# Patient Record
Sex: Male | Born: 1935 | Race: White | Hispanic: No | Smoking: Former smoker
Health system: Southern US, Community
[De-identification: ages and names within clinical notes are randomized; demographics above are authoritative.]

## PROBLEM LIST (undated history)

## (undated) DIAGNOSIS — I10 Essential (primary) hypertension: Secondary | ICD-10-CM

## (undated) DIAGNOSIS — N400 Enlarged prostate without lower urinary tract symptoms: Secondary | ICD-10-CM

## (undated) DIAGNOSIS — Z95810 Presence of automatic (implantable) cardiac defibrillator: Secondary | ICD-10-CM

## (undated) DIAGNOSIS — Z950 Presence of cardiac pacemaker: Secondary | ICD-10-CM

## (undated) DIAGNOSIS — I255 Ischemic cardiomyopathy: Secondary | ICD-10-CM

## (undated) DIAGNOSIS — I495 Sick sinus syndrome: Secondary | ICD-10-CM

## (undated) DIAGNOSIS — C189 Malignant neoplasm of colon, unspecified: Secondary | ICD-10-CM

## (undated) HISTORY — DX: Benign prostatic hyperplasia without lower urinary tract symptoms: N40.0

## (undated) HISTORY — DX: Presence of cardiac pacemaker: Z95.0

## (undated) HISTORY — PX: CARDIAC DEFIBRILLATOR PLACEMENT: SHX171

## (undated) HISTORY — DX: Presence of automatic (implantable) cardiac defibrillator: Z95.810

## (undated) HISTORY — PX: CORONARY ARTERY BYPASS GRAFT: SHX141

## (undated) HISTORY — DX: Ischemic cardiomyopathy: I25.5

## (undated) HISTORY — DX: Essential (primary) hypertension: I10

## (undated) HISTORY — PX: OTHER SURGICAL HISTORY: SHX169

## (undated) HISTORY — DX: Malignant neoplasm of colon, unspecified: C18.9

## (undated) HISTORY — DX: Sick sinus syndrome: I49.5

---

## 1998-09-06 ENCOUNTER — Inpatient Hospital Stay (HOSPITAL_COMMUNITY): Admission: AD | Admit: 1998-09-06 | Discharge: 1998-09-14 | Payer: Self-pay | Admitting: Cardiology

## 1998-09-12 ENCOUNTER — Encounter: Payer: Self-pay | Admitting: Thoracic Surgery (Cardiothoracic Vascular Surgery)

## 1998-09-13 ENCOUNTER — Encounter: Payer: Self-pay | Admitting: Thoracic Surgery (Cardiothoracic Vascular Surgery)

## 2005-03-02 ENCOUNTER — Ambulatory Visit: Payer: Self-pay | Admitting: Internal Medicine

## 2005-03-11 ENCOUNTER — Ambulatory Visit: Payer: Self-pay | Admitting: Cardiology

## 2005-03-11 ENCOUNTER — Inpatient Hospital Stay (HOSPITAL_BASED_OUTPATIENT_CLINIC_OR_DEPARTMENT_OTHER): Admission: RE | Admit: 2005-03-11 | Discharge: 2005-03-11 | Payer: Self-pay | Admitting: Cardiology

## 2005-04-15 ENCOUNTER — Ambulatory Visit: Payer: Self-pay | Admitting: Internal Medicine

## 2005-04-15 ENCOUNTER — Ambulatory Visit (HOSPITAL_COMMUNITY): Admission: RE | Admit: 2005-04-15 | Discharge: 2005-04-16 | Payer: Self-pay | Admitting: Internal Medicine

## 2005-04-29 ENCOUNTER — Inpatient Hospital Stay (HOSPITAL_COMMUNITY): Admission: RE | Admit: 2005-04-29 | Discharge: 2005-05-02 | Payer: Self-pay | Admitting: Internal Medicine

## 2005-05-14 ENCOUNTER — Ambulatory Visit: Payer: Self-pay

## 2005-08-11 ENCOUNTER — Ambulatory Visit: Payer: Self-pay | Admitting: Internal Medicine

## 2005-11-03 ENCOUNTER — Ambulatory Visit: Payer: Self-pay | Admitting: Internal Medicine

## 2006-02-04 ENCOUNTER — Ambulatory Visit: Payer: Self-pay | Admitting: Internal Medicine

## 2006-05-31 ENCOUNTER — Ambulatory Visit: Payer: Self-pay | Admitting: Internal Medicine

## 2006-08-17 ENCOUNTER — Ambulatory Visit: Payer: Self-pay | Admitting: Internal Medicine

## 2006-11-16 ENCOUNTER — Ambulatory Visit: Payer: Self-pay | Admitting: Internal Medicine

## 2007-05-17 ENCOUNTER — Ambulatory Visit: Payer: Self-pay | Admitting: Internal Medicine

## 2007-07-10 IMAGING — CR DG CHEST 2V
2 series · 2 of 2 positions shown · non-contrast
Comparison: None.

CLINICAL DATA: 78-year-old with left ventricular dysfunction.  Pacemaker insertion.
 CHEST ? 2 VIEW:

[view not recorded (1 of 2)]
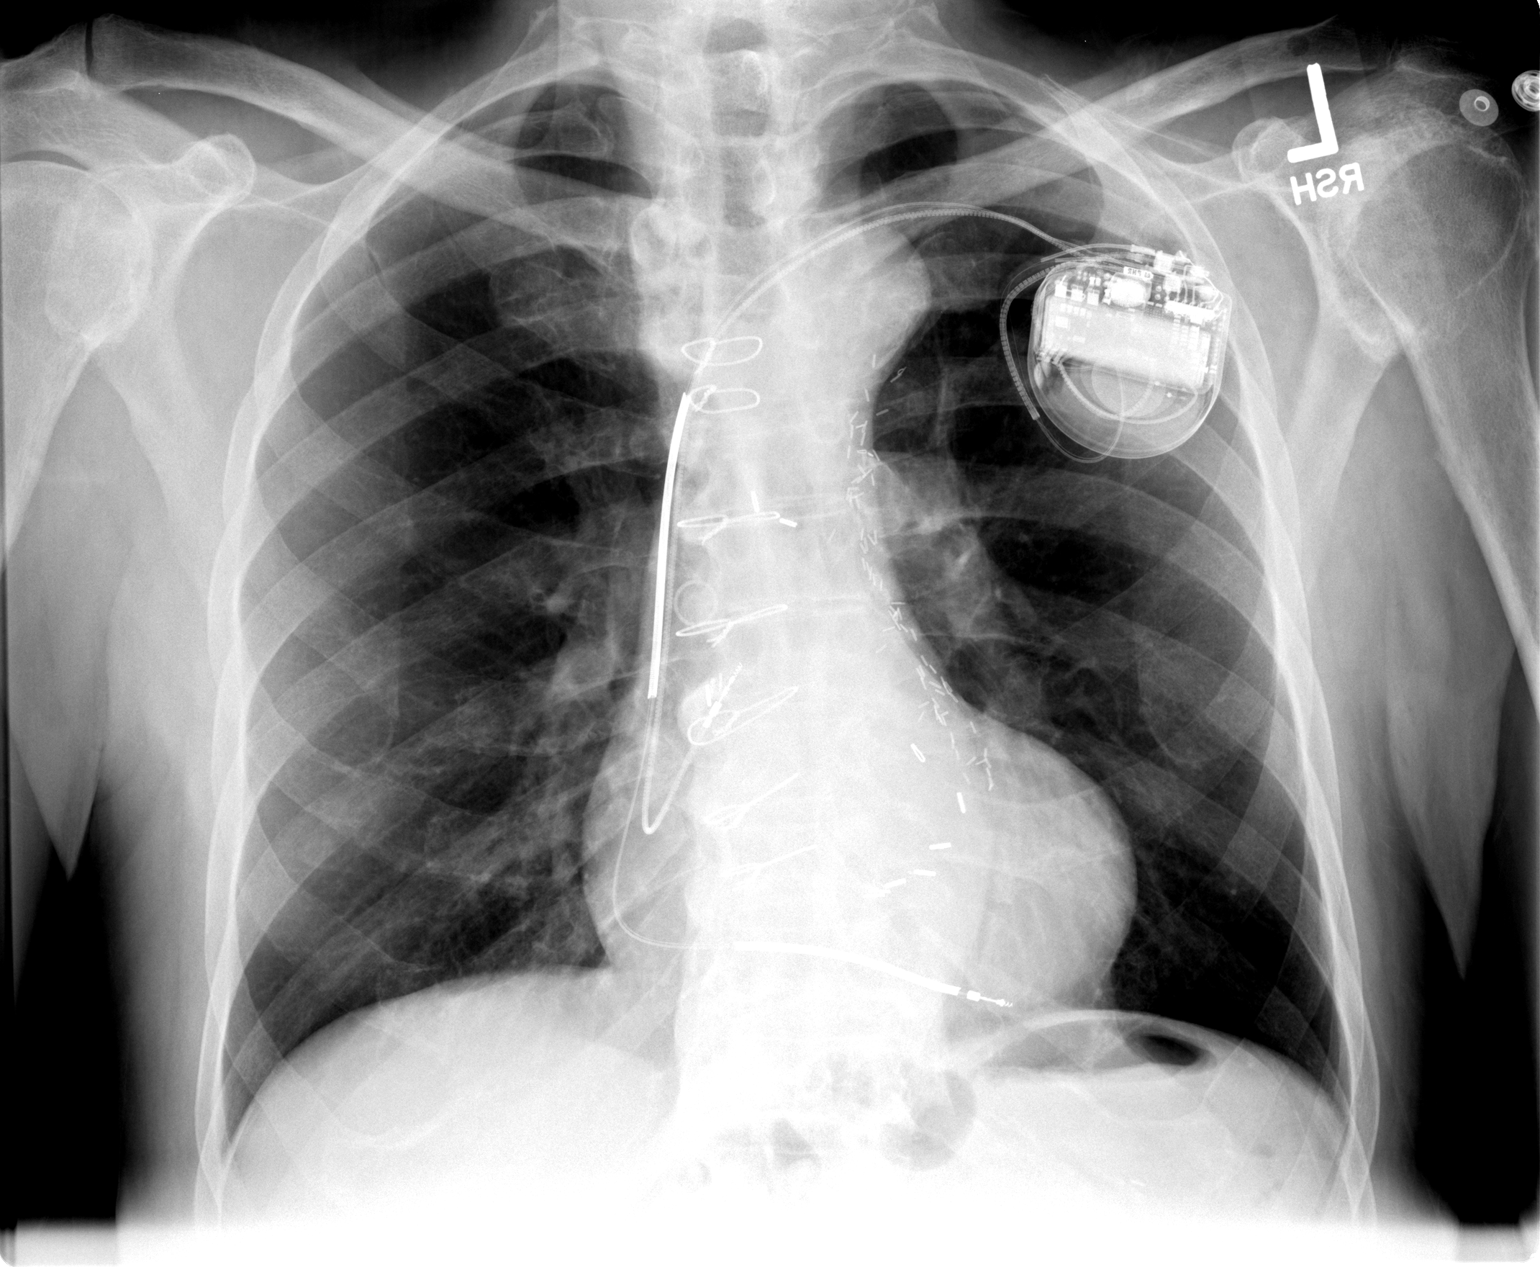

[view not recorded (2 of 2)]
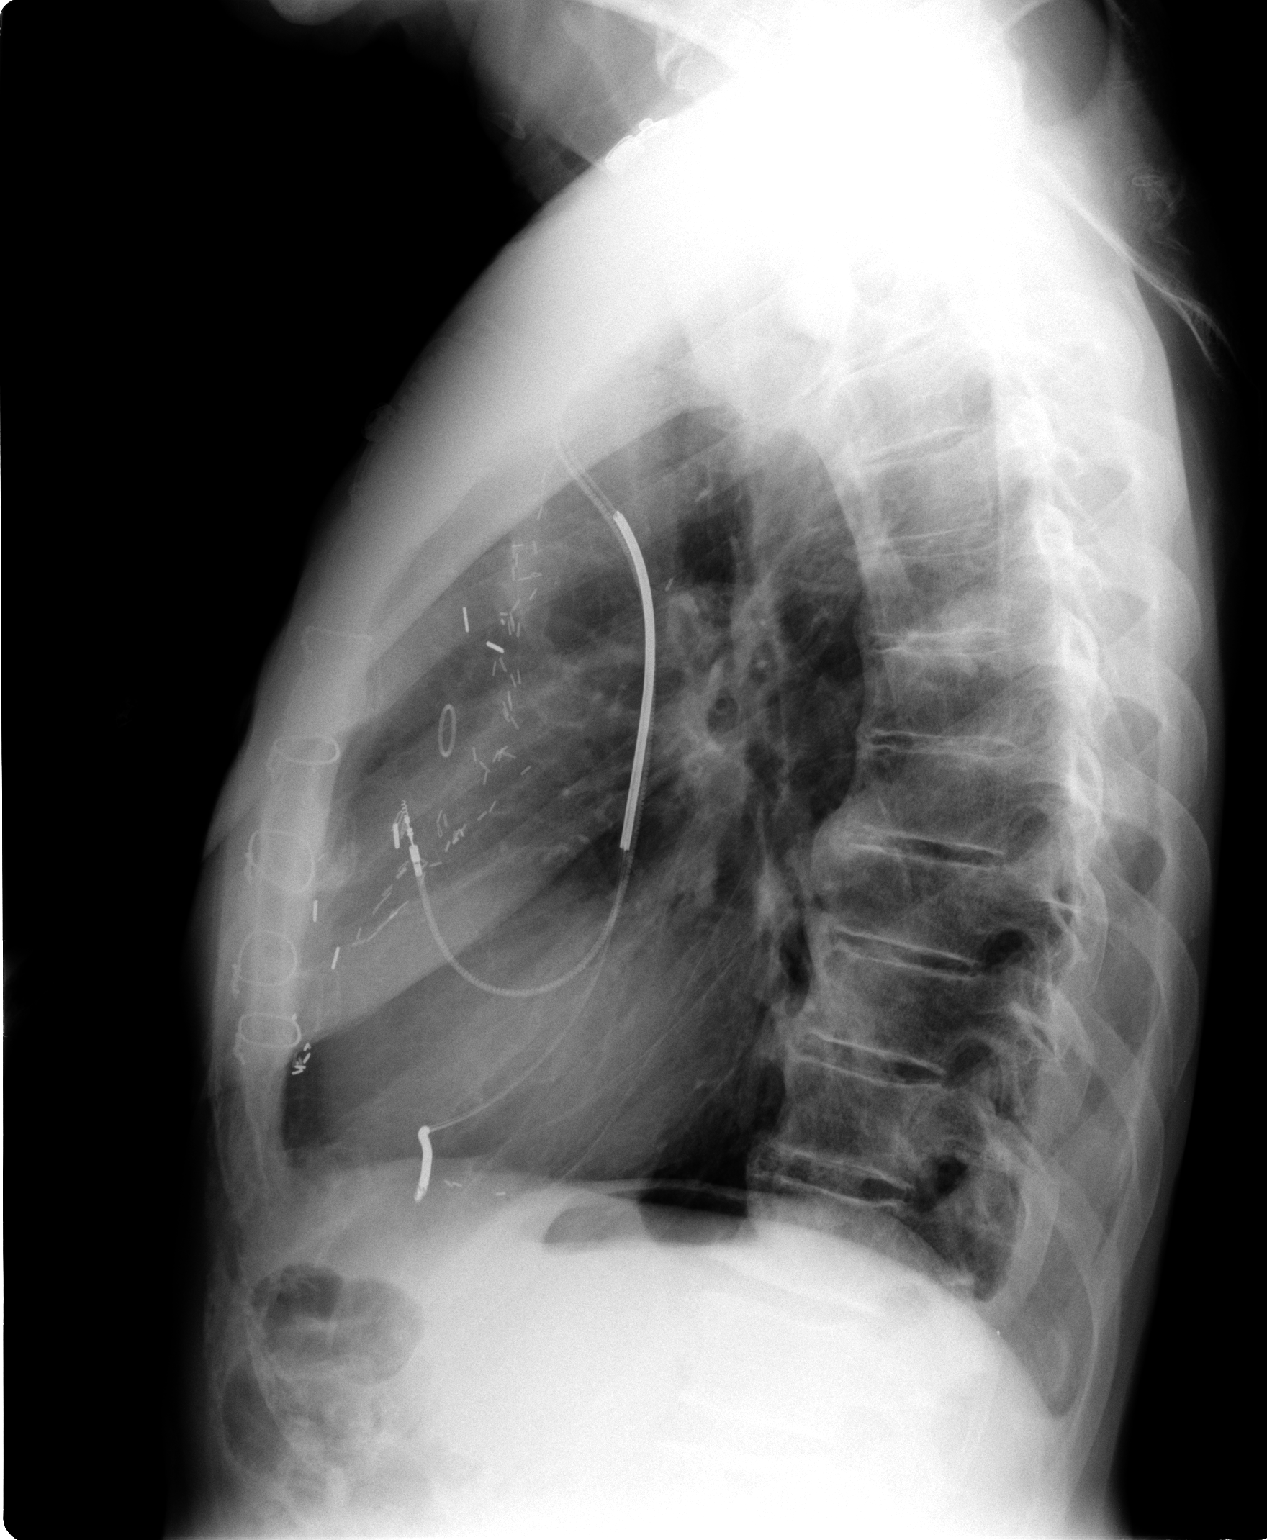

[2 of 2 positions shown; findings below may reference images not displayed]

FINDINGS: There are pacer wires and AICD in good position with one wire in the right atrium and one in the right ventricle.  No pneumothorax is seen.  Heart size is within normal limits, and lungs are clear.
IMPRESSION: 1.  Pacer wires in good position without complicating features.
 2.  No acute pulmonary findings.

## 2007-08-16 ENCOUNTER — Ambulatory Visit: Payer: Self-pay | Admitting: Internal Medicine

## 2008-02-14 ENCOUNTER — Ambulatory Visit: Payer: Self-pay | Admitting: Internal Medicine

## 2008-05-22 ENCOUNTER — Ambulatory Visit: Payer: Self-pay | Admitting: Internal Medicine

## 2008-05-23 ENCOUNTER — Ambulatory Visit: Payer: Self-pay | Admitting: Internal Medicine

## 2008-07-13 ENCOUNTER — Encounter: Payer: Self-pay | Admitting: Internal Medicine

## 2008-08-21 ENCOUNTER — Ambulatory Visit: Payer: Self-pay | Admitting: Internal Medicine

## 2008-08-30 ENCOUNTER — Encounter (INDEPENDENT_AMBULATORY_CARE_PROVIDER_SITE_OTHER): Payer: Self-pay | Admitting: *Deleted

## 2008-11-20 ENCOUNTER — Ambulatory Visit: Payer: Self-pay | Admitting: Internal Medicine

## 2008-11-20 ENCOUNTER — Encounter: Payer: Self-pay | Admitting: Internal Medicine

## 2008-11-21 ENCOUNTER — Encounter: Payer: Self-pay | Admitting: Internal Medicine

## 2009-02-19 ENCOUNTER — Ambulatory Visit: Payer: Self-pay | Admitting: Internal Medicine

## 2009-03-11 ENCOUNTER — Encounter: Payer: Self-pay | Admitting: Internal Medicine

## 2009-05-23 DIAGNOSIS — Z95810 Presence of automatic (implantable) cardiac defibrillator: Secondary | ICD-10-CM | POA: Insufficient documentation

## 2009-05-23 DIAGNOSIS — I2589 Other forms of chronic ischemic heart disease: Secondary | ICD-10-CM

## 2009-05-24 ENCOUNTER — Ambulatory Visit: Payer: Self-pay | Admitting: Internal Medicine

## 2009-08-20 ENCOUNTER — Ambulatory Visit: Payer: Self-pay | Admitting: Internal Medicine

## 2009-08-30 ENCOUNTER — Encounter: Payer: Self-pay | Admitting: Internal Medicine

## 2009-11-21 ENCOUNTER — Ambulatory Visit: Payer: Self-pay | Admitting: Internal Medicine

## 2009-12-03 ENCOUNTER — Encounter: Payer: Self-pay | Admitting: Internal Medicine

## 2010-02-20 ENCOUNTER — Ambulatory Visit: Payer: Self-pay | Admitting: Internal Medicine

## 2010-04-03 ENCOUNTER — Encounter (INDEPENDENT_AMBULATORY_CARE_PROVIDER_SITE_OTHER): Payer: Self-pay | Admitting: *Deleted

## 2010-06-03 NOTE — Cardiovascular Report (Signed)
Summary: Office Visit Remote   Office Visit Remote   Imported By: Roderic Ovens 12/06/2009 10:42:15  _____________________________________________________________________  External Attachment:    Type:   Image     Comment:   External Document

## 2010-06-03 NOTE — Cardiovascular Report (Signed)
Summary: Office Visit   Office Visit   Imported By: Roderic Ovens 05/31/2009 15:52:05  _____________________________________________________________________  External Attachment:    Type:   Image     Comment:   External Document

## 2010-06-03 NOTE — Assessment & Plan Note (Signed)
Summary: 1 YR  ICD CK/MEDTRONIC   CC:  1 year icd check.  History of Present Illness:   [name] is seen in followup for an ICD implanted in the setting of ischemic cardiomyopathy.He is a history of bypass surgery. He also has history of sinus node dysfunction  most recent catheterization we have is 2006 demonstrating ejection fraction of 35-40% as well as a patent LIMA and vein graft to the posterior descending     Current Medications (verified): 1)  Amlodipine Besylate 5 Mg Tabs (Amlodipine Besylate) .... Take As Directed 2)  Benazepril Hcl 10 Mg Tabs (Benazepril Hcl) .... Once Daily 3)  Metoprolol Tartrate 50 Mg Tabs (Metoprolol Tartrate) .... 1/2 Tablet By Mouth Twice Daily 4)  Vitamin B-12 500 Mcg Tabs (Cyanocobalamin) .... Once Daily 5)  Centrum  Tabs (Multiple Vitamins-Minerals) .... Once Daily 6)  Ecotrin 325 Mg Tbec (Aspirin) .... Once Daily  Allergies (verified): 1)  ! * Celebrex 2)  ! Carvedilol  Past History:  Past Medical History: Last updated: 05/23/2009 Sinus node dysfunction Ischemic cardiomyopathy Status post implantable cardioverter-defibrillator for the above-Medtronic EnTrust  Past Surgical History: Status post implantable cardioverter-defibrillator-Medtronic EnTrust status post CABG  Family History: Negative FH of Diabetes, Hypertension, or Coronary Artery Disease  Social History: Retired   Vital Signs:  Patient profile:   77 year old male Height:      70 inches Weight:      169 pounds BMI:     24.34 Pulse rate:   65 / minute Pulse rhythm:   regular BP sitting:   160 / 80  (right arm) Cuff size:   large  Vitals Entered By: Judithe Modest CMA (May 24, 2009 10:19 AM)  Physical Exam  General:  The patient was alert and oriented in no acute distress. HEENT Normal.  Neck veins were flat, carotids were brisk.  Lungs were clear.  Heart sounds were regular without murmurs or gallops.  Abdomen was soft with active bowel sounds. There is  no clubbing cyanosis or edema. Skin Warm and dry     ICD Specifications Following MD:  Sherryl Manges, MD     ICD Vendor:  Medtronic     ICD Model Number:  D154ATG     ICD Serial Number:  [id number] ICD DOI:  04/29/2005     ICD Implanting MD:  Sherryl Manges, MD  Lead 1:    Location: RA     DOI: 04/29/2005     Model #: 4098     Serial #: [id number]     Status: active Lead 2:    Location: RV     DOI: 04/29/2005     Model #: 6213     Serial #: [id number]     Status: active  Indications::  ICM   ICD Follow Up Remote Check?  No Battery Voltage:  2.96 V     Charge Time:  9.6 seconds     Underlying rhythm:  SR ICD Dependent:  No       ICD Device Measurements Atrium:  Amplitude: 3.7 mV, Impedance: 472 ohms, Threshold: 0.5 V at 0.4 msec Right Ventricle:  Amplitude: 6.8 mV, Impedance: 464 ohms, Threshold: 0.5 V at 0.4 msec Shock Impedance: 49/60 ohms   Episodes MS Episodes:  2     Percent Mode Switch:  <0.1%     Coumadin:  No Shock:  0     ATP:  0     Nonsustained:  1      Brady Parameters  Mode DDDR+     Lower Rate Limit:  60     Upper Rate Limit 130 PAV 180     Sensed AV Delay:  150  Tachy Zones VF:  200     VT:  250     VT1:  162     Next Remote Date:  08/20/2009     Next Cardiology Appt Due:  05/04/2010 Tech Comments:   FR sensing PVC's A-sensitivity reprogrammed  0.44mV.  6949 lead stable, SIC  1.  Updated letter and magnet given to patient.  Carelink transmissions every 3 months. ROV 1 year Dr. Graciela Husbands. Altha Harm, LPN  May 24, 2009 10:59 AM   Impression & Recommendations:  Problem # 1:  CARDIOMYOPATHY, ISCHEMIC (ICD-414.8) the patient is stable post CABG. He is able to do his exercise bike 30 minutes a day. We will continue him on his current medications. It might be reasonable for DrRevenkar to consider the outpatient prolactinoma His updated medication list for this problem includes:    Amlodipine Besylate 5 Mg Tabs (Amlodipine besylate) .Marland Kitchen... Take as directed    Benazepril  Hcl 10 Mg Tabs (Benazepril hcl) ..... Once daily    Metoprolol Tartrate 50 Mg Tabs (Metoprolol tartrate) .Marland Kitchen... 1/2 tablet by mouth twice daily    Ecotrin 325 Mg Tbec (Aspirin) ..... Once daily  Orders: EKG w/ Interpretation (93000)  Problem # 2:  IMPLANTATION OF DEFIBRILLATOR, MEDTRONIC ENTRUST (ICD-V45.02) Device parameters and data were reviewed and no changes were made   Orders: EKG w/ Interpretation (93000)  Problem # 3:  SINUS NODE DYSFUNCTION (ICD-427.81) reasonable heart rate excursion with programming His updated medication list for this problem includes:    Amlodipine Besylate 5 Mg Tabs (Amlodipine besylate) .Marland Kitchen... Take as directed    Benazepril Hcl 10 Mg Tabs (Benazepril hcl) ..... Once daily    Metoprolol Tartrate 50 Mg Tabs (Metoprolol tartrate) .Marland Kitchen... 1/2 tablet by mouth twice daily    Ecotrin 325 Mg Tbec (Aspirin) ..... Once daily  Problem # 4:  FIDELIS [id number] (ICD-996.04) Concerns were reviewed  and letter Simona Huh given   Patient Instructions: 1)  Your physician recommends that you schedule a follow-up appointment in: 12 months in [place]

## 2010-06-03 NOTE — Letter (Signed)
Summary: Remote Device Check  [facility], Main Office  [address]   [place], [place] [address]   Phone: [phone]  Fax: [phone]     April 03, 2010 MRN: [id number]   [name] [address] [place], [place]  [address]   Dear [name],   Your remote transmission was recieved and reviewed by your physician.  All diagnostics were within normal limits for you.  _____Your next transmission is scheduled for:                       .  Please transmit at any time this day.  If you have a wireless device your transmission will be sent automatically.  __X____Your next office visit is scheduled for: January 2012 with Dr. Graciela Husbands in [place].   Please call our office to schedule an appointment.    Sincerely,  Altha Harm, LPN

## 2010-06-03 NOTE — Cardiovascular Report (Signed)
Summary: Office Visit Remote   Office Visit Remote   Imported By: Roderic Ovens 09/04/2009 10:10:40  _____________________________________________________________________  External Attachment:    Type:   Image     Comment:   External Document

## 2010-06-03 NOTE — Letter (Signed)
Summary: Remote Device Check  [facility], Main Office  [address]   [place], [place] [address]   Phone: [phone]  Fax: [phone]     December 03, 2009 MRN: [id number]   [name] [address] [place], [place]  [address]   Dear [name],   Your remote transmission was recieved and reviewed by your physician.  All diagnostics were within normal limits for you.  __X___Your next transmission is scheduled for:  02-20-2010.  Please transmit at any time this day.  If you have a wireless device your transmission will be sent automatically.    Sincerely,  Vella Kohler

## 2010-06-03 NOTE — Letter (Signed)
Summary: Remote Device Check  [facility], Main Office  [address]   [place], [place] [address]   Phone: [phone]  Fax: [phone]     August 30, 2009 MRN: [id number]   [facility] [address] [place], [place]  [address]   Dear Mr. Funderburg,   Your remote transmission was recieved and reviewed by your physician.  All diagnostics were within normal limits for you.  __X___Your next transmission is scheduled for:   November 21, 2009.  Please transmit at any time this day.  If you have a wireless device your transmission will be sent automatically.     Sincerely,  Proofreader

## 2010-06-23 ENCOUNTER — Ambulatory Visit: Payer: Self-pay | Admitting: Internal Medicine

## 2010-06-23 ENCOUNTER — Encounter: Payer: Self-pay | Admitting: Internal Medicine

## 2010-06-23 ENCOUNTER — Encounter (INDEPENDENT_AMBULATORY_CARE_PROVIDER_SITE_OTHER): Payer: Medicare Other | Admitting: Internal Medicine

## 2010-06-23 DIAGNOSIS — Z95810 Presence of automatic (implantable) cardiac defibrillator: Secondary | ICD-10-CM

## 2010-06-23 DIAGNOSIS — I2589 Other forms of chronic ischemic heart disease: Secondary | ICD-10-CM

## 2010-06-23 DIAGNOSIS — I4891 Unspecified atrial fibrillation: Secondary | ICD-10-CM

## 2010-07-01 NOTE — Cardiovascular Report (Signed)
Summary: Office Visit   Office Visit   Imported By: Roderic Ovens 06/27/2010 09:12:06  _____________________________________________________________________  External Attachment:    Type:   Image     Comment:   External Document

## 2010-07-01 NOTE — Assessment & Plan Note (Signed)
Summary: medtronic /saf/glc   Allergies: 1)  ! * Celebrex 2)  ! Carvedilol    ICD Specifications Following MD:  Sherryl Manges, MD     ICD Vendor:  Medtronic     ICD Model Number:  [id number]     ICD Serial Number:  [id number] ICD DOI:  04/29/2005     ICD Implanting MD:  Sherryl Manges, MD  Lead 1:    Location: RA     DOI: 04/29/2005     Model #: 1308     Serial #: [id number]     Status: active Lead 2:    Location: RV     DOI: 04/29/2005     Model #: 5284     Serial #: [id number]     Status: active  Indications::  ICM   ICD Follow Up ICD Dependent:  No      Episodes Coumadin:  No  Brady Parameters Mode DDDR+     Lower Rate Limit:  60     Upper Rate Limit 130 PAV 180     Sensed AV Delay:  150  Tachy Zones VF:  200     VT:  250     VT1:  162

## 2010-07-25 NOTE — Letter (Signed)
June 23, 2010   [name]. Revankar, M.D. [address] [place], [place] [address]  RE:  HAYNES, GIANNOTTI MRN:  [id number]  /  DOB:  1926/05/27  Dear Elby Showers,  I hope this letter finds you and your family well.  Mr. Amend was seen in followup for his ICD implanted for primary prevention in the setting of ischemic heart disease.  He has had an intercurrent hospitalization for bleeding apparently from his kidney.  This has been associated significant residual weakness, from which he is gradually improving.  He has had no problems with chest pain.  There has been mild shortness of breath, which is chronic. Notably around the time of his hospitalization for his bladder infection, he developed atrial fibrillation.  He was told in the hospital that he had "congestive heart failure," notwithstanding the paucity of symptoms.  I suspect this was related to an elevated BNP.  His current medications include aspirin, amlodipine 5, benazepril 10, metoprolol 50 b.i.d., Septra.  PHYSICAL EXAMINATION:  VITAL SIGNS:  His blood pressure is 132/70, pulse was 61, weight was 173. NECK:  His neck veins were flat. LUNGS:  Clear. HEART:  Sounds were regular.  The pacemaker pocket was well-healed. ABDOMEN:  Soft. EXTREMITIES:  Without edema. GENERAL:  He was alert and oriented in no acute distress. SKIN:  Warm and dry.  Interrogation of his Medtronic EnTrust ICD demonstrates a [phone] lead. There are no intercurrent therapies.  He did have an episode of atrial fibrillation, as noted previously that lasted for about 48 hours.  IMPRESSIONS: 1. Ischemic cardiomyopathy. 2. Status post implantable cardioverter-defibrillator for number one. 3. Atrial fibrillation 4. 21308 lead.  We reviewed again the importance of prompt medical attention in the event that he has recurrent beats from his defibrillator.  Furthermore, we have reviewed the presence of atrial fibrillation.  He has a CHADS-VASc score of  greater than or equal to 4.  It would be appropriate for him to take long-term anticoagulation if the atrial fibrillation recurs.  There is some possibility that his first manifestation would be a stroke, but the occurrence of atrial fibrillation temporally related to his bladder issues makes me wonder whether initiation of anticoagulation at this point is necessary.  We will follow this closely via his Care Link.  Thank you very much for allowing [place] to participate in his care.   Sincerely,     Duke Salvia, MD, [facility]   SCK/MedQ  DD: 06/23/2010  DT: 06/23/2010  Job #: [id number]

## 2010-09-16 NOTE — Letter (Signed)
May 16, 2007    Aundra Dubin. Revankar, M.D.  [address]  [place], [place] [address]   RE:  RASHAUD, YBARBO  MRN:  [id number]  /  DOB:  11-Nov-1926   Dear Elby Showers:   I hope this letter finds you well and your family as well.   Mr. Pipkins is seen following ICD implantation for ischemic heart disease  and frequent ventricular ectopy.  He has had no intercurrent episodes.   His medications are notable for taking metoprolol 25 b.i.d.  He is  noticeably not on an ACE inhibitor as well or on an ARB.   On examination, his blood pressure was elevated at 158/76, his pulse was  58.  LUNGS: Clear.  Heart sounds were regular.  EXTREMITIES:  Without edema.   Interrogation of his Medtronic ICD demonstrates an R wave of 6.1 with an  impedance of 448, a threshold of 1 V at 0.4.  The P wave was 3.3 with  impedance of 432, and the threshold was also 1 V at 0.4.  There were no  intercurrent episodes.   IMPRESSION:  1. Sinus node dysfunction.  2. Ischemic heart disease.  3. Status post implantable cardioverter-defibrillator for #2 with some      impact on #1.  4. Absence of an ACE inhibitor.   Mr. Noe, Goyer, is doing well.  I asked him to talk with you about  whether he would be a candidate for an ACE inhibitor as well as whether  he might be on a long-acting metoprolol or Coreg preparation.   I told him that you would have great deal more information about this  than I would so I would defer it to you.  We will see him again in one  year's time.    Sincerely,      Duke Salvia, MD, [facility]  Electronically Signed    SCK/MedQ  DD: 05/17/2007  DT: 05/17/2007  Job #: [id number]   CC:    Renae Fickle

## 2010-09-16 NOTE — Letter (Signed)
May 22, 2008    Aundra Dubin. Revankar, MD  [address]  [place], [place] [address]   RE:  ANDREZ, LIEURANCE  MRN:  [id number]  /  DOB:  [birth year]   Dear Elby Showers,   Mr. Mccullars comes in followup for his ischemic cardiomyopathy, he is  status post ICD implantation for primary prevention.  He did not  tolerate the Coreg that was tried last time.  You did get him on ACE  inhibitor in the form of Lotrel 5/10 and otherwise he is doing really  quite well at this point.   OTHER MEDICATION:  Metoprolol 12.5.   PHYSICAL EXAMINATION:  VITAL SIGNS:  Blood pressure is 130/72, the pulse  was 62, his weight was 170, which is stable.  NECK:  Veins were flat.  LUNGS:  Clear.  HEART:  Sounds were regular.  ABDOMEN:  Soft.  EXTREMITIES:  Without edema.   Interrogation of his Medtronic Itrel pulse generator demonstrates P-wave  of 3.2 with impedance of 460, threshold 1 volt at 0.4, the R-wave of  7.4, impedance of 416, the threshold of 0.5 at 0.7.  Battery volt was  3.03.  There is no intercurrent episodes.  He is 73% atrial paced.   IMPRESSION:  1. Sinus node dysfunction.  2. Ischemic cardiomyopathy.  3. Status post implantable cardioverter-defibrillator for the above.  4. Intolerance of Coreg.   Mr. Kaufhold is doing really well.   We will plan to continue to follow up remotely, if there is anything we  can do in the interim, please do not hesitate to contact me.    Sincerely,      Duke Salvia, MD, [facility]  Electronically Signed    SCK/MedQ  DD: 05/22/2008  DT: 05/23/2008  Job #: [id number]

## 2010-09-19 NOTE — Op Note (Signed)
NAME:  [name], [name] NO.:  [id number]   MEDICAL RECORD NO.:  [id number]           PATIENT TYPE:   LOCATION:                                 FACILITY:   PHYSICIAN:  Duke Salvia, M.D.       DATE OF BIRTH:   DATE OF PROCEDURE:  DATE OF DISCHARGE:                                 OPERATIVE REPORT   PREOPERATIVE DIAGNOSIS:  Cardiomyopathy with frequent monomorphic  ventricular ectopy with a left bundle branch block configuration.   POSTOPERATIVE DIAGNOSIS:  Cardiomyopathy with frequent monomorphic  ventricular ectopy with a left bundle branch block configuration.   PROCEDURE:  Invasive electrophysiological study, arrhythmia mapping, and  radiofrequency catheter ablation.   Following obtaining informed consent, the patient was brought to the  electrophysiology laboratory and placed on the fluoroscopic table in the  supine position.  After routine prep and drape, the cardiac catheterization  was performed with local anesthesia and conscious sedation.  Noninvasive  blood pressure monitoring, transcutaneous oxygen saturation monitoring and  tidal CO2 monitoring were performed continuously throughout the procedure.  Following the procedure, the catheters were removed, hemostasis was  obtained, and the patient was transferred to the holding area in stable  condition.   The catheter was a 5-French quadripolar catheter.  It was inserted via the  left femoral vein to the AV junction to measure the His electrogram.   A 7-French ESI EC1000 mapping array was inserted via the left femoral vein  using wire support into the right pulmonary artery to the inflow portion of  the right ventricle.   A 5 French ablation type catheter was inserted in the right femoral vein to  mapping sites in the right ventricle.   Surface leads I, AVF, and V1 were monitored continuously throughout the  procedure.  Following insertion of the catheters, stimulation protocol  included  intraventricular pacing.   BASELINE MEASUREMENTS:  Initial - sinus.  Cycle length 1275 msec.  PR interval 199 msec.  QRS duration 124 msec.  QT interval 449 msec.  P-wave duration N/M.  AH interval: 150 msec; HP interval:  35 msec.   AV nodal function was not assessed during this study.   A geometry was created of the right ventricular inflow portion.  Paced  mapping and electrogram activation mapping were utilized to identify sites  of early activation and breakout.  It was noted that no intraventricular  site was earlier than zero seconds compared to the surface onset.  In  addition, the downstroke slur of the unipolar electrograms was really quite  broad.   In one location we had a pace map of close to 12 out of 12.  RF energy was  delivered at this site; however, it had no impact on the ventricular ectopy.   Total fluoroscopy time was 31 minutes (I think).  Radiofrequency energy:  A  total of 65 seconds of RF energy was applied.   At this point, given the electrogram assessments and the mapping results, it  was elected to terminate the procedure.  The catheters were removed.  Hemostasis was  obtained.  The patient was then transferred to the holding  area in stable condition with sheaths to be removed.   The goals of therapy that we are to decide are whether to pursue ventricular  ectopy suppression with antiarrhythmic drugs and follow the impact on his  cardiomyopathy, to implant an ICD for primary prevention, or a combination  of the two.   This was subsequently resolved on the following day, where it was elected to  proceed with ICD implantation and subsequent Tikosyn administration.           ______________________________  Duke Salvia, M.D.     SCK/MEDQ  D:  04/17/2005  T:  04/20/2005  Job:  [id number]   cc:   Aundra Dubin. Revankar, M.D.  Fax: [phone]

## 2010-09-19 NOTE — Assessment & Plan Note (Signed)
[facility]                         ELECTROPHYSIOLOGY OFFICE NOTE   NAME:[name], [name]                         MRN:          [id number]  DATE:05/31/2006                            DOB:          08-21-1926    Mr. [name] was seen today in the clinic on May 31, 2006, for followup  of his Medtronic model No. D154-ATG, in trust.  Date of implant was  April 29, 2005, for ischemic cardiomyopathy.  On interrogation of his  device today, his battery voltage is 3.14 with a charge time of 8.1  seconds.  P-waves measured 0.7 millivolts  with an atrial capture  threshold of 0.5 volts at 0.4 milliseconds, and an atrial lead impedence  of 424 ohms.  R-waves measured 6.8 millivolts  with a ventricular  capture threshold of 1 volt at 0.4 milliseconds and a ventricular lead  impedence of 432 ohms.  Shock impedence was 50.  There was one non-  sustained episode since last interrogation.  He does have a 6949 alert  lead and it was reprogrammed according to protocol.  No other changes  made in his parameters.  He will send a Care-Link transmission at 3, 6  and 9 months' time with a return office visit in one year.      Altha Harm, LPN  Electronically Signed      Duke Salvia, MD, [facility]  Electronically Signed   PO/MedQ  DD: 05/31/2006  DT: 05/31/2006  Job #: [id number]

## 2010-09-19 NOTE — Cardiovascular Report (Signed)
NAMEMAIKA, Wagner NO.:  [id number]   MEDICAL RECORD NO.:  [id number]          PATIENT TYPE:  OIB   LOCATION:  1963                         FACILITY:  [facility]   PHYSICIAN:  [name], M.D. LHCDATE OF BIRTH:  [birth year]   DATE OF PROCEDURE:  03/11/2005  DATE OF DISCHARGE:                              CARDIAC CATHETERIZATION   REQUESTING PHYSICIAN:  Dr. Sherryl Wagner   INDICATIONS:  [name] is a 77 year old male with a history of coronary  disease status post coronary artery bypass grafting in May of 2000 including  a LIMA to the left anterior descending and saphenous vein graft to the  posterior descending.  He has recently documented left ventricular  dysfunction with an ejection fraction in the range of 25-30% by  echocardiography as well as frequent ventricular ectopy.  He is undergoing  evaluation for potential electrophysiology testing and defibrillator  placement and this study is requested to define coronary anatomy,  specifically addressing graft patency.  The risks and benefits of the  procedure were explained to the patient and he has agreed to proceed.  Informed consent was obtained prior to procedure.   PROCEDURE PERFORMED:  1.  Left heart catheterization.  2.  Selective coronary angiography.  3.  Left ventriculography.  4.  Bypass graft angiography.   ACCESS AND EQUIPMENT:  The area about the right femoral artery was  anesthetized 1% lidocaine.  A 4-French sheath was placed in the right  femoral artery via the modified Seldinger technique.  Standard preformed 4-  [place] and JR4 catheters were used for selective coronary angiography  and saphenous vein graft angiography.  An internal mammary artery catheter  was used for injection of the internal mammary artery graft, although this  was unable to be performed selectively given tortuosity of the aorta and  difficulty in manipulating the catheter into position.  All exchanges were  made over a wire.  The patient tolerated the procedure well without any  complications.   HEMODYNAMIC RESULTS:  Aorta 159/70 mmHg.  Left ventricle 151/10 mmHg.   ANGIOGRAPHIC FINDINGS:  1.  The left main coronary artery is medium in caliber and calcified with      approximately 20% mid vessel stenosis.  This vessel gives rise to the      left anterior descending and circumflex coronary arteries as well as a      small ramus intermedius branch.  2.  The left anterior descending is calcified proximally and occluded.  3.  The circumflex coronary artery is medium in caliber with small AV groove      branch and large essentially trifurcating obtuse marginal.  There are      30% stenoses in the mid circumflex and proximal obtuse marginal system.  4.  The right coronary artery is a medium-caliber vessel that is diffusely      diseased in its proximal to mid vessel segment including approximately      70% diffuse stenosis followed by focal 95% stenosis and subtotal      occlusion at the mid vessel segment just beyond the  last of two RV      marginal branches.  5.  The saphenous vein graft to the posterior descending branch of the right      coronary artery is widely patent.  6.  The left internal mammary artery graft to the left anterior descending      is grossly patent and fills partially via nonselective injections.  The      distal left anterior descending is seen filling back up towards the mid      vessel level with what looked to be two small diagonal branches which      are not bypassed.   Left ventriculography was performed in the RAO projection.  Reveals an  ejection fraction estimated at 35-40%, although in the setting of  substantial ventricular ectopy reducing the accuracy of this finding.  There  looks to be inferior basal and anterior apical hypokinesis and 1+ mitral  regurgitation.   DIAGNOSES:  1.  Patent left internal mammary artery graft to the left anterior       descending as well as patent saphenous vein graft to the posterior      descending branch of the right coronary artery with nonobstructive      coronary disease involving the circumflex.  2.  Left ventricular ejection fraction of approximately 35-40% in the      setting of significant ventricular ectopy reducing the accuracy of this      finding.  There is 1+ mitral regurgitation and a left ventricle end-      diastolic pressure of 10 mmHg.   DISCUSSION:  I reviewed the results with the patient and available family  members.  Plan will be for the patient to follow up with [name] in the  office to review his recent Holter monitor as well as cardiac  catheterization results as part of his ongoing electrophysiology assessment.           ______________________________  [name], M.D. [facility]     SGM/MEDQ  D:  03/11/2005  T:  03/11/2005  Job:  [id number]   cc:   [name], M.D.  Fax: [phone]

## 2010-09-19 NOTE — Discharge Summary (Signed)
[name], [name]                ACCOUNT NO.:  [id number]   MEDICAL RECORD NO.:  [id number]          PATIENT TYPE:  OIB   LOCATION:  6533                         FACILITY:  [facility]   PHYSICIAN:  Duke Salvia, M.D.  DATE OF BIRTH:  [birth year]   DATE OF ADMISSION:  04/15/2005  DATE OF DISCHARGE:  04/16/2005                                 DISCHARGE SUMMARY   ALLERGIES:  Celebrex.   PRINCIPAL DIAGNOSES:  1.  Discharging day 1 status post electrophysiology study, radiofrequency      catheter ablation attempted to site of premature ventricular complexes      originating in the epicardium from the inferior right ventricular inflow      tract.  This study was not successful in eliminating the cause of his      incessant PVCs.  2.  The patient has persistent PVCs/bigeminy with recent decline in ejection      fraction to 25-30%.   SECONDARY DIAGNOSES:  1.  Severe 2- vessel coronary artery disease status post off-pump coronary      artery bypass graft surgery x2, the left internal mammary artery to the      left anterior descending and a saphenous vein graft to the posterior      descending.  This was done May 2000.  2.  Ischemic cardiomyopathy.  3.  Exercise intolerance.  4.  Hypertension.  5.  Borderline diabetes.   PROCEDURES:  April 15, 2005.  Electrophysiology study with attempted  radiofrequency catheter ablation of focus of persistent PVCs in this  patient.   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  In 2000 he presented with presyncope and was found to have  significant ischemia.  Heart catheterization showed severe 2-vessel disease  and he had undergone coronary artery bypass graft surgery off bypass pump in  May of 2000.  Recent echocardiogram shows ejection fraction of 25-30% which  is a significant decline from last measurement.  The patient is having some  chest discomfort without exertion.  The question remains  whether the  deteriorating left ventricular function is secondary to frequent ventricular  ectopy or due to ischemia.  It is possible that ventricular ectopic beats  can be associated with cardiomyopathy.  To rule out ischemia, the patient  will have outpatient adenosine stress study with Cardiolite injection.  The  patient is a candidate for electrophysiology study with possible  radiofrequency catheter ablation, with an attempt to identify the source of  his PVCs.  The patient is also a candidate for implantable cardioverter  defibrillator and this could be done after attempted radiofrequency catheter  ablation.   HOSPITAL COURSE:  The patient presented electively December 13.  He  underwent electrophysiology with attempted radiofrequency catheter ablation  of his focus of premature ventricular contractions.  The study showed that  the PVCs originated in the epicardium from the inferior right ventricular  inflow tract.  The procedure was unable to eliminate this focus.  The  patient continues with incessant PVCs, largely asymptomatic except for  decreased  exercise tolerance.  The patient will be discharged day 1 after  the procedure.  He goes home on the same medications as prior to procedure.   DISCHARGE LABORATORIES:  Complete blood count, white cells 4.6, hemoglobin  12.7, hematocrit 36.5, and platelets 189.  Serum electrolytes this  admission, sodium 138, potassium 4.8, chloride 102, carbonate 29, BUN 15,  creatinine 1 and glucose 138.  TSH is pending.  Liver function tests studies  were obtained.  Alkaline phosphatase 68, SGOT 20, SGPT 19.   FOLLOW UP:  1.  An adenosine Cardiolite study the week of December 18.  This will be      scheduled by Dr. Kem Parkinson office and the results will be greatly      appreciated to help guide [place] with cardioverter defibrillator placement.  2.  The patient will return to [facility] Wednesday, December 27 at      8 a.m. for defibrillator  implantation.  He is asked to eat nothing after      midnight December 26.  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  It is planned that the patient will also be started      on Tikosyn with monitoring for successive as an inpatient at [facility] for 72 hours.   Maple Mirza, PA-C dictating this visit and discharge 40 minutes.      Maple Mirza, P.A.    ______________________________  Duke Salvia, M.D.    GM/MEDQ  D:  04/16/2005  T:  04/17/2005  Job:  [id number]   cc:   Aundra Dubin. Revankar, M.D.  Fax: [phone]   Renae Fickle  Fax: [phone]

## 2010-09-19 NOTE — Op Note (Signed)
[name], [name] NO.:  [id number]   MEDICAL RECORD NO.:  [id number]          PATIENT TYPE:  INP   LOCATION:  6523                         FACILITY:  [facility]   PHYSICIAN:  Duke Salvia, M.D.  DATE OF BIRTH:  09-Aug-1926   DATE OF PROCEDURE:  04/29/2005  DATE OF DISCHARGE:                                 OPERATIVE REPORT   PREOPERATIVE DIAGNOSIS:  Ischemic cardiomyopathy with depressed left  ventricular function, frequent ventricular ectopy.   POSTOPERATIVE DIAGNOSIS:  Ischemic cardiomyopathy with depressed left  ventricular function, frequent ventricular ectopy.   PROCEDURE:  Implantable defibrillator insertion with interoperative  defibrillation threshold testing.   Following obtaining informed consent, the patient was brought to the  electrophysiology laboratory and placed on the fluoroscopy table in supine  position.  After routine prep and drape of the left upper chest, lidocaine  was infiltrated in the prepectoral subclavicular region.  An incision was  made and carried down to the layer of the prepectoral fascia using  electrocautery and sharp dissection.  A pocket was formed similarly.  Hemostasis was obtained.   Next, attention was turned to gaining access to the extrathoracic left  subclavian vein which was accomplished without difficulty without the  aspiration of air or puncture of the artery.  Two guide-wires were placed  and retained, a 0 silk suture was placed in a figure-of-eight fashion and  allowed to hang loosely.  Sequentially, 7 French tear away introducer  sheaths were placed through which was then passed a Medtronic 6949, 65 cm  dual coil active defibrillator lead, serial number [id number].  This was  followed by a Medtronic atrial lead 5086, 52 cm, serial number [id number].  Under fluoroscopic guidance, these were manipulated to the right ventricular  apex and the right atrial appendage respectively where the bipolar R wave  was  7.2 millivolts with a pacing impedance of 734 ohms and a threshold of  0.4 volts at 0.5 milliseconds, current threshold was 0.7 MA, and there was  no diaphragmatic pacing at 10 volts.  The bipolar P-wave was 4 millivolts  with a pacing impedance of 633 ohms, threshold 0.6 volts at 0.5  milliseconds, and current threshold 1.3 MA.   With these acceptable parameters recorded, the leads were secured to the  prepectoral fascia and attached to a Medtronic Entrust D154ATG ICD, serial  number [id number].  Through the device, the P wave was 2.2 millivolts and  pacing impedance 488 ohms, threshold 1 volt at 0.5 milliseconds.  The  bipolar R wave was 7.5 millivolts on paper, recorded at 3.5 volts and up to  4.7 via the device with a pacing impedance of 552 ohms and threshold of 0.5  volts at 0.5 milliseconds.  The proximal coil impedance was 55, the distal  coil impedance was 45 ohms.  With these acceptable parameters recorded,  defibrillation threshold testing was undertaken.  Ventricular fibrillation  was induced via the T wave shock.  After a total duration of 6 seconds, a 15  joule shock was delivered to a measured resistance of 41 ohms terminating  ventricular fibrillation and  restoring sinus rhythm.  After a wait of 5-6  minutes, ventricular fibrillation was reinduced via the T wave shock.  After  a total duration of 5.5 seconds, a 15 joule shock was delivered to a  measured resistance of 41 ohms terminating ventricular fibrillation and  restoring a sinus rhythm.   With these acceptable parameters recorded, the device was implanted.  The  pocket was copiously irrigated with antibiotic containing saline solution.  Hemostasis was assured, and the leads and pulse generator were placed in the  pocket and secured to the prepectoral fascia.  The wound was closed in three  layers in a normal fashion.  The wound  was washed, dried, and a Benzoin and Steri-Strip dressing was applied.  Needle counts,  sponge counts, and instrument counts were correct at the end  of the procedure according to the staff.  The patient tolerated the  procedure without apparent complications.           ______________________________  Duke Salvia, M.D.     SCK/MEDQ  D:  04/29/2005  T:  04/29/2005  Job:  [id number]   cc:   Aundra Dubin. Revankar, M.D.  Fax: [phone]

## 2010-09-19 NOTE — Discharge Summary (Signed)
[name] NO.:  [id number]   MEDICAL RECORD NO.:  [id number]          PATIENT TYPE:  INP   LOCATION:  6523                         FACILITY:  [facility]   PHYSICIAN:  Duke Salvia, M.D.  DATE OF BIRTH:  [birth year]   DATE OF ADMISSION:  04/29/2005  DATE OF DISCHARGE:  05/02/2005                                 DISCHARGE SUMMARY   The patient has an allergy to CELEBREX.   PRINCIPAL DIAGNOSES:  1.  Coronary artery disease, status post off-pump coronary artery bypass      graft surgery x2.  2.  Ischemic cardiomyopathy, ejection fraction 20-30%.  3.  Left bundle branch block.  4.  Possible cardiomyopathy secondary to frequent ventricular ectopy.  5.  Status post implantation of Medtronic cardioverter-defibrillator      April 29, 2005.  6.  Attempted PVC suppression, failed with Tikosyn trial.   SECONDARY DIAGNOSES:  1.  Electrophysiology study with attempted radiofrequency catheter ablation      of site of PVCs originating in the epicardium of the inferior right      ventricular inflow tract, April 15, 2005 (unsuccessful).  2.  Hypertension.  3.  Dyslipidemia.  4.  Borderline diabetes.  5.  Exercise intolerance.   PROCEDURE:  April 29, 2005, implantation of Medtronic cardioverter-  defibrillator with defibrillator threshold study, Duke Salvia, M.D.   BRIEF HISTORY:  [name] is a 77 year old male with a history of  coronary artery disease, status post coronary artery bypass graft surgery  x2.  This was an off-pump procedure.  The patient has ischemic  cardiomyopathy with ejection fraction of 20-30%, has class II-III congestive  heart failure symptoms.  He has left bundle branch block.  He has frequent  ventricular ectopy, which may be contributing to the patient's  cardiomyopathy.  He underwent attempted radiofrequency catheter ablation of  PVCs but electrophysiology study showed that they originated in the  epicardium of the  inferior right ventricular inflow tract.  This procedure  was attempted on April 15, 2005.   The patient qualifies for implantable cardioverter-defibrillator for primary  prevention of sudden cardiac death.  The risks and benefits of the procedure  have been described to the patient, and he is willing to proceed.  In  addition, it is felt that perhaps after implantation, the patient could be  given a trial of Tikosyn to see if this will help to suppress PVC burden.   HOSPITAL COURSE:  The patient presented to [facility] on April 29, 2005.  He underwent implantation of Medtronic EnTrust cardioverter-  defibrillator with defibrillation threshold study.  There were no  complications to the procedure.  He was started on Tikosyn the night of the  procedure and received approximately five doses at [facility]  before the trial was cancelled due to no significant effect on ventricular  ectopy burden.  The trial was discontinued and the patient was discharged  the same day.  At that time his incision was healing nicely.  The device had  been interrogated and all values were within  normal limits.  The patient was  A-pacing with a regular rate and rhythm.   He discharges on the medications that he has had preoperatively:  1.  Metoprolol 12.5 mg p.o. b.i.d.  2.  Lotrel 5/10 mg one daily.  3.  Enteric-coated aspirin 325 mg daily.  4.  Multivitamin daily.   He has follow-up appointments at [facility], [address], the ICD clinic May 14, 2005, at 9 o'clock in the morning, and he  will see Dr. Graciela Husbands August 11, 2005, at 11:50 in the morning.  He is asked not  to lift anything heavier than 10 pounds for the next two weeks and not to  drive for the next five days.  He is to keep his incision dry for the next  five days and to sponge bathe until Wednesday, January 3.   Laboratory studies the day of discharge, December 30:  Electrolytes:  Sodium  139,  potassium 4, chloride 106, carbonate 28, BUN is 13, creatinine 1,  glucose 133.  Complete blood count this admission:  White cells 4.9,  hemoglobin 13, hematocrit 37.5 and platelets of 168.      [name], P.A.    ______________________________  Duke Salvia, M.D.    GM/MEDQ  D:  07/06/2005  T:  07/07/2005  Job:  [id number]   cc:   Aundra Dubin. Revankar, M.D.  Fax: [phone]

## 2010-09-25 ENCOUNTER — Ambulatory Visit (INDEPENDENT_AMBULATORY_CARE_PROVIDER_SITE_OTHER): Payer: Medicare Other | Admitting: *Deleted

## 2010-09-25 DIAGNOSIS — I428 Other cardiomyopathies: Secondary | ICD-10-CM

## 2010-09-26 ENCOUNTER — Other Ambulatory Visit: Payer: Self-pay

## 2010-10-09 ENCOUNTER — Encounter: Payer: Self-pay | Admitting: *Deleted

## 2010-10-28 ENCOUNTER — Encounter: Payer: Self-pay | Admitting: Cardiovascular Disease

## 2010-11-06 NOTE — Progress Notes (Signed)
ICD remote 

## 2010-12-24 ENCOUNTER — Other Ambulatory Visit: Payer: Self-pay | Admitting: Internal Medicine

## 2010-12-25 ENCOUNTER — Ambulatory Visit (INDEPENDENT_AMBULATORY_CARE_PROVIDER_SITE_OTHER): Payer: Medicare Other | Admitting: *Deleted

## 2010-12-25 DIAGNOSIS — I428 Other cardiomyopathies: Secondary | ICD-10-CM

## 2010-12-26 LAB — REMOTE ICD DEVICE
AL AMPLITUDE: 2.9 mv
AL IMPEDENCE ICD: 416 Ohm
ATRIAL PACING ICD: 76.26 pct
BAMS-0001: 170 {beats}/min
BATTERY VOLTAGE: 2.64 V
FVT: 0
RV LEAD AMPLITUDE: 4.7 mv
RV LEAD IMPEDENCE ICD: 432 Ohm
TOT-0006: 20061227000000
TZAT-0001ATACH: 1
TZAT-0001FASTVT: 1
TZAT-0002ATACH: NEGATIVE
TZAT-0004FASTVT: 8
TZAT-0004SLOWVT: 8
TZAT-0004SLOWVT: 8
TZAT-0005FASTVT: 88 pct
TZAT-0011FASTVT: 10 ms
TZAT-0011SLOWVT: 10 ms
TZAT-0011SLOWVT: 10 ms
TZAT-0012ATACH: 150 ms
TZAT-0012ATACH: 150 ms
TZAT-0012FASTVT: 200 ms
TZAT-0012SLOWVT: 200 ms
TZAT-0012SLOWVT: 200 ms
TZAT-0013FASTVT: 1
TZAT-0013SLOWVT: 2
TZAT-0013SLOWVT: 2
TZAT-0018FASTVT: NEGATIVE
TZAT-0019SLOWVT: 8 V
TZAT-0020ATACH: 1.5 ms
TZAT-0020ATACH: 1.5 ms
TZAT-0020ATACH: 1.5 ms
TZON-0003ATACH: 350 ms
TZON-0003FASTVT: 240 ms
TZON-0003SLOWVT: 370 ms
TZON-0003VSLOWVT: 450 ms
TZON-0004SLOWVT: 16
TZST-0001ATACH: 4
TZST-0001FASTVT: 2
TZST-0001FASTVT: 3
TZST-0001FASTVT: 5
TZST-0001SLOWVT: 4
TZST-0001SLOWVT: 6
TZST-0002ATACH: NEGATIVE
TZST-0003FASTVT: 25 J
TZST-0003FASTVT: 35 J
TZST-0003SLOWVT: 15 J
TZST-0003SLOWVT: 35 J
VF: 0

## 2010-12-31 ENCOUNTER — Encounter: Payer: Self-pay | Admitting: *Deleted

## 2011-01-03 DEATH — deceased

## 2011-01-13 NOTE — Progress Notes (Signed)
ICD was checked by remote  

## 2011-02-26 ENCOUNTER — Ambulatory Visit (INDEPENDENT_AMBULATORY_CARE_PROVIDER_SITE_OTHER): Payer: Medicare Other | Admitting: *Deleted

## 2011-02-26 ENCOUNTER — Encounter: Payer: Self-pay | Admitting: Internal Medicine

## 2011-02-26 ENCOUNTER — Other Ambulatory Visit: Payer: Self-pay | Admitting: Internal Medicine

## 2011-02-26 DIAGNOSIS — I428 Other cardiomyopathies: Secondary | ICD-10-CM

## 2011-03-01 LAB — REMOTE ICD DEVICE
AL AMPLITUDE: 2.3 mv
BAMS-0001: 170 {beats}/min
BATTERY VOLTAGE: 2.64 V
CHARGE TIME: 11.781 s
DEV-0020ICD: NEGATIVE
RV LEAD AMPLITUDE: 4.4 mv
RV LEAD IMPEDENCE ICD: 408 Ohm
TOT-0001: 2
TOT-0002: 1
TOT-0006: 20061227000000
TZAT-0001ATACH: 1
TZAT-0001ATACH: 2
TZAT-0001SLOWVT: 1
TZAT-0001SLOWVT: 2
TZAT-0002ATACH: NEGATIVE
TZAT-0002ATACH: NEGATIVE
TZAT-0004FASTVT: 8
TZAT-0004SLOWVT: 8
TZAT-0004SLOWVT: 8
TZAT-0005FASTVT: 88 pct
TZAT-0005SLOWVT: 84 pct
TZAT-0005SLOWVT: 91 pct
TZAT-0011FASTVT: 10 ms
TZAT-0011SLOWVT: 10 ms
TZAT-0012FASTVT: 200 ms
TZAT-0018ATACH: NEGATIVE
TZAT-0018ATACH: NEGATIVE
TZAT-0018ATACH: NEGATIVE
TZAT-0019ATACH: 6 V
TZAT-0019FASTVT: 8 V
TZON-0003ATACH: 350 ms
TZON-0004SLOWVT: 16
TZON-0004VSLOWVT: 20
TZST-0001FASTVT: 3
TZST-0001FASTVT: 6
TZST-0001SLOWVT: 3
TZST-0001SLOWVT: 6
TZST-0002ATACH: NEGATIVE
TZST-0002ATACH: NEGATIVE
TZST-0003FASTVT: 15 J
TZST-0003FASTVT: 35 J
TZST-0003SLOWVT: 15 J
TZST-0003SLOWVT: 35 J

## 2011-03-05 NOTE — Progress Notes (Signed)
ICD remote 

## 2011-03-12 ENCOUNTER — Encounter: Payer: Self-pay | Admitting: *Deleted

## 2011-04-02 ENCOUNTER — Ambulatory Visit (INDEPENDENT_AMBULATORY_CARE_PROVIDER_SITE_OTHER): Payer: Medicare Other | Admitting: *Deleted

## 2011-04-02 ENCOUNTER — Encounter: Payer: Self-pay | Admitting: Internal Medicine

## 2011-04-02 ENCOUNTER — Encounter: Payer: Medicare Other | Admitting: *Deleted

## 2011-04-02 ENCOUNTER — Other Ambulatory Visit: Payer: Self-pay | Admitting: Internal Medicine

## 2011-04-02 DIAGNOSIS — R0989 Other specified symptoms and signs involving the circulatory and respiratory systems: Secondary | ICD-10-CM

## 2011-04-02 DIAGNOSIS — I428 Other cardiomyopathies: Secondary | ICD-10-CM

## 2011-04-03 LAB — REMOTE ICD DEVICE
BAMS-0001: 170 {beats}/min
BATTERY VOLTAGE: 2.64 V
DEV-0020ICD: NEGATIVE
FVT: 0
PACEART VT: 0
RV LEAD AMPLITUDE: 4.7 mv
TZAT-0001ATACH: 1
TZAT-0001ATACH: 2
TZAT-0001SLOWVT: 1
TZAT-0002ATACH: NEGATIVE
TZAT-0002ATACH: NEGATIVE
TZAT-0004SLOWVT: 8
TZAT-0004SLOWVT: 8
TZAT-0005FASTVT: 88 pct
TZAT-0005SLOWVT: 84 pct
TZAT-0005SLOWVT: 91 pct
TZAT-0011FASTVT: 10 ms
TZAT-0011SLOWVT: 10 ms
TZAT-0011SLOWVT: 10 ms
TZAT-0012ATACH: 150 ms
TZAT-0012ATACH: 150 ms
TZAT-0012FASTVT: 200 ms
TZAT-0012SLOWVT: 200 ms
TZAT-0012SLOWVT: 200 ms
TZAT-0013SLOWVT: 2
TZAT-0013SLOWVT: 2
TZAT-0018ATACH: NEGATIVE
TZAT-0018ATACH: NEGATIVE
TZAT-0018ATACH: NEGATIVE
TZAT-0018FASTVT: NEGATIVE
TZAT-0018SLOWVT: NEGATIVE
TZAT-0018SLOWVT: NEGATIVE
TZAT-0019ATACH: 6 V
TZAT-0019ATACH: 6 V
TZAT-0019FASTVT: 8 V
TZON-0003ATACH: 350 ms
TZON-0003FASTVT: 240 ms
TZON-0003SLOWVT: 370 ms
TZON-0004SLOWVT: 16
TZON-0004VSLOWVT: 20
TZON-0005SLOWVT: 12
TZST-0001ATACH: 4
TZST-0001ATACH: 5
TZST-0001FASTVT: 3
TZST-0001FASTVT: 5
TZST-0001FASTVT: 6
TZST-0001SLOWVT: 4
TZST-0001SLOWVT: 6
TZST-0002ATACH: NEGATIVE
TZST-0003FASTVT: 15 J
TZST-0003FASTVT: 35 J
TZST-0003SLOWVT: 25 J
VF: 0

## 2011-04-07 NOTE — Progress Notes (Signed)
Remote icd check  

## 2011-04-09 ENCOUNTER — Encounter: Payer: Self-pay | Admitting: *Deleted

## 2011-04-30 ENCOUNTER — Other Ambulatory Visit: Payer: Self-pay | Admitting: Internal Medicine

## 2011-04-30 ENCOUNTER — Ambulatory Visit (INDEPENDENT_AMBULATORY_CARE_PROVIDER_SITE_OTHER): Payer: Medicare Other | Admitting: *Deleted

## 2011-04-30 ENCOUNTER — Encounter: Payer: Self-pay | Admitting: Internal Medicine

## 2011-04-30 DIAGNOSIS — I428 Other cardiomyopathies: Secondary | ICD-10-CM

## 2011-04-30 DIAGNOSIS — Z95810 Presence of automatic (implantable) cardiac defibrillator: Secondary | ICD-10-CM

## 2011-05-01 LAB — REMOTE ICD DEVICE
AL AMPLITUDE: 2.6 mv
CHARGE TIME: 11.781 s
DEV-0020ICD: NEGATIVE
FVT: 0
RV LEAD AMPLITUDE: 4.7 mv
RV LEAD IMPEDENCE ICD: 424 Ohm
TOT-0001: 2
TOT-0002: 1
TZAT-0001ATACH: 1
TZAT-0001ATACH: 2
TZAT-0001ATACH: 3
TZAT-0001SLOWVT: 1
TZAT-0001SLOWVT: 2
TZAT-0002ATACH: NEGATIVE
TZAT-0002ATACH: NEGATIVE
TZAT-0004FASTVT: 8
TZAT-0004SLOWVT: 8
TZAT-0004SLOWVT: 8
TZAT-0005FASTVT: 88 pct
TZAT-0005SLOWVT: 84 pct
TZAT-0005SLOWVT: 91 pct
TZAT-0011FASTVT: 10 ms
TZAT-0012ATACH: 150 ms
TZAT-0012ATACH: 150 ms
TZAT-0012ATACH: 150 ms
TZAT-0013SLOWVT: 2
TZAT-0013SLOWVT: 2
TZAT-0018ATACH: NEGATIVE
TZAT-0018ATACH: NEGATIVE
TZAT-0019ATACH: 6 V
TZAT-0020ATACH: 1.5 ms
TZAT-0020SLOWVT: 1.5 ms
TZON-0003ATACH: 350 ms
TZON-0003SLOWVT: 370 ms
TZON-0003VSLOWVT: 450 ms
TZON-0004SLOWVT: 16
TZON-0004VSLOWVT: 20
TZST-0001ATACH: 4
TZST-0001ATACH: 6
TZST-0001FASTVT: 3
TZST-0001FASTVT: 5
TZST-0001FASTVT: 6
TZST-0001SLOWVT: 3
TZST-0001SLOWVT: 5
TZST-0001SLOWVT: 6
TZST-0002ATACH: NEGATIVE
TZST-0002ATACH: NEGATIVE
TZST-0003FASTVT: 15 J
TZST-0003FASTVT: 35 J
TZST-0003FASTVT: 35 J
TZST-0003SLOWVT: 15 J
TZST-0003SLOWVT: 35 J

## 2011-05-07 NOTE — Progress Notes (Signed)
Remote icd check  

## 2011-05-14 DIAGNOSIS — N400 Enlarged prostate without lower urinary tract symptoms: Secondary | ICD-10-CM | POA: Diagnosis not present

## 2011-05-14 DIAGNOSIS — M999 Biomechanical lesion, unspecified: Secondary | ICD-10-CM | POA: Diagnosis not present

## 2011-05-14 DIAGNOSIS — M5137 Other intervertebral disc degeneration, lumbosacral region: Secondary | ICD-10-CM | POA: Diagnosis not present

## 2011-05-14 DIAGNOSIS — IMO0002 Reserved for concepts with insufficient information to code with codable children: Secondary | ICD-10-CM | POA: Diagnosis not present

## 2011-05-14 DIAGNOSIS — R339 Retention of urine, unspecified: Secondary | ICD-10-CM | POA: Diagnosis not present

## 2011-05-14 DIAGNOSIS — M545 Low back pain: Secondary | ICD-10-CM | POA: Diagnosis not present

## 2011-05-14 DIAGNOSIS — R972 Elevated prostate specific antigen [PSA]: Secondary | ICD-10-CM | POA: Diagnosis not present

## 2011-05-21 ENCOUNTER — Encounter: Payer: Self-pay | Admitting: *Deleted

## 2011-05-29 DIAGNOSIS — E119 Type 2 diabetes mellitus without complications: Secondary | ICD-10-CM | POA: Diagnosis not present

## 2011-05-29 DIAGNOSIS — I10 Essential (primary) hypertension: Secondary | ICD-10-CM | POA: Diagnosis not present

## 2011-05-29 DIAGNOSIS — Z79899 Other long term (current) drug therapy: Secondary | ICD-10-CM | POA: Diagnosis not present

## 2011-05-29 DIAGNOSIS — E785 Hyperlipidemia, unspecified: Secondary | ICD-10-CM | POA: Diagnosis not present

## 2011-05-29 DIAGNOSIS — I2510 Atherosclerotic heart disease of native coronary artery without angina pectoris: Secondary | ICD-10-CM | POA: Diagnosis not present

## 2011-06-04 ENCOUNTER — Encounter: Payer: Self-pay | Admitting: Internal Medicine

## 2011-06-04 ENCOUNTER — Ambulatory Visit (INDEPENDENT_AMBULATORY_CARE_PROVIDER_SITE_OTHER): Payer: Medicare Other | Admitting: *Deleted

## 2011-06-04 DIAGNOSIS — Z95810 Presence of automatic (implantable) cardiac defibrillator: Secondary | ICD-10-CM

## 2011-06-04 DIAGNOSIS — I428 Other cardiomyopathies: Secondary | ICD-10-CM

## 2011-06-06 LAB — REMOTE ICD DEVICE
AL AMPLITUDE: 2 mv
BAMS-0001: 170 {beats}/min
DEV-0020ICD: NEGATIVE
FVT: 0
PACEART VT: 0
RV LEAD AMPLITUDE: 4.4 mv
RV LEAD IMPEDENCE ICD: 408 Ohm
TZAT-0001ATACH: 1
TZAT-0001ATACH: 2
TZAT-0001ATACH: 3
TZAT-0004SLOWVT: 8
TZAT-0004SLOWVT: 8
TZAT-0005FASTVT: 88 pct
TZAT-0005SLOWVT: 84 pct
TZAT-0005SLOWVT: 91 pct
TZAT-0011FASTVT: 10 ms
TZAT-0012ATACH: 150 ms
TZAT-0012ATACH: 150 ms
TZAT-0012FASTVT: 200 ms
TZAT-0012SLOWVT: 200 ms
TZAT-0012SLOWVT: 200 ms
TZAT-0013FASTVT: 1
TZAT-0013SLOWVT: 2
TZAT-0013SLOWVT: 2
TZAT-0018ATACH: NEGATIVE
TZAT-0018ATACH: NEGATIVE
TZAT-0018FASTVT: NEGATIVE
TZAT-0019ATACH: 6 V
TZAT-0019FASTVT: 8 V
TZAT-0020ATACH: 1.5 ms
TZON-0003ATACH: 350 ms
TZON-0003FASTVT: 240 ms
TZON-0003SLOWVT: 370 ms
TZON-0004SLOWVT: 16
TZON-0004VSLOWVT: 20
TZON-0005SLOWVT: 12
TZON-0010SLOWVT: 30 ms
TZST-0001ATACH: 4
TZST-0001ATACH: 6
TZST-0001FASTVT: 3
TZST-0001FASTVT: 5
TZST-0001SLOWVT: 4
TZST-0001SLOWVT: 6
TZST-0002ATACH: NEGATIVE
TZST-0003FASTVT: 35 J
TZST-0003FASTVT: 35 J
TZST-0003SLOWVT: 25 J
TZST-0003SLOWVT: 35 J
VF: 0

## 2011-06-10 NOTE — Progress Notes (Signed)
Remote defib check  

## 2011-06-18 DIAGNOSIS — I2510 Atherosclerotic heart disease of native coronary artery without angina pectoris: Secondary | ICD-10-CM | POA: Diagnosis not present

## 2011-06-18 DIAGNOSIS — I10 Essential (primary) hypertension: Secondary | ICD-10-CM | POA: Diagnosis not present

## 2011-06-18 DIAGNOSIS — E119 Type 2 diabetes mellitus without complications: Secondary | ICD-10-CM | POA: Diagnosis not present

## 2011-06-18 DIAGNOSIS — I428 Other cardiomyopathies: Secondary | ICD-10-CM | POA: Diagnosis not present

## 2011-06-30 ENCOUNTER — Ambulatory Visit (INDEPENDENT_AMBULATORY_CARE_PROVIDER_SITE_OTHER): Payer: Medicare Other | Admitting: Internal Medicine

## 2011-06-30 ENCOUNTER — Encounter: Payer: Self-pay | Admitting: Internal Medicine

## 2011-06-30 DIAGNOSIS — Z95810 Presence of automatic (implantable) cardiac defibrillator: Secondary | ICD-10-CM

## 2011-06-30 DIAGNOSIS — I495 Sick sinus syndrome: Secondary | ICD-10-CM | POA: Diagnosis not present

## 2011-06-30 DIAGNOSIS — I2589 Other forms of chronic ischemic heart disease: Secondary | ICD-10-CM

## 2011-06-30 LAB — ICD DEVICE OBSERVATION
AL AMPLITUDE: 1.7 mv
BAMS-0001: 170 {beats}/min
CHARGE TIME: 13 s
DEV-0020ICD: NEGATIVE
RV LEAD AMPLITUDE: 4.4 mv
TZAT-0001ATACH: 1
TZAT-0001ATACH: 2
TZAT-0004FASTVT: 8
TZAT-0004SLOWVT: 8
TZAT-0004SLOWVT: 8
TZAT-0005FASTVT: 88 pct
TZAT-0005SLOWVT: 84 pct
TZAT-0005SLOWVT: 91 pct
TZAT-0011FASTVT: 10 ms
TZAT-0011SLOWVT: 10 ms
TZAT-0011SLOWVT: 10 ms
TZAT-0012ATACH: 150 ms
TZAT-0012ATACH: 150 ms
TZAT-0012ATACH: 150 ms
TZAT-0012SLOWVT: 200 ms
TZAT-0012SLOWVT: 200 ms
TZAT-0013FASTVT: 1
TZAT-0013SLOWVT: 2
TZAT-0013SLOWVT: 2
TZAT-0018ATACH: NEGATIVE
TZAT-0018ATACH: NEGATIVE
TZAT-0018FASTVT: NEGATIVE
TZAT-0020ATACH: 1.5 ms
TZAT-0020ATACH: 1.5 ms
TZAT-0020ATACH: 1.5 ms
TZAT-0020SLOWVT: 1.5 ms
TZON-0003ATACH: 350 ms
TZON-0003FASTVT: 240 ms
TZON-0003SLOWVT: 370 ms
TZON-0003VSLOWVT: 450 ms
TZON-0004SLOWVT: 16
TZON-0010SLOWVT: 30 ms
TZST-0001ATACH: 4
TZST-0001FASTVT: 3
TZST-0001FASTVT: 5
TZST-0001SLOWVT: 4
TZST-0001SLOWVT: 5
TZST-0001SLOWVT: 6
TZST-0002ATACH: NEGATIVE
TZST-0003FASTVT: 25 J
TZST-0003FASTVT: 35 J
TZST-0003FASTVT: 35 J
TZST-0003SLOWVT: 15 J
TZST-0003SLOWVT: 25 J
TZST-0003SLOWVT: 35 J

## 2011-06-30 NOTE — Assessment & Plan Note (Signed)
Stable  As above

## 2011-06-30 NOTE — Progress Notes (Signed)
  HPI  [name] is a 78 y.o. male is seen in followup for an ICD implanted in the setting of ischemic cardiomyopathy.  The patient deniesshortness of breath, nocturnal dyspnea, orthopnea or peripheral edema.  There have been no palpitations, lightheadedness or syncope. He is having fleeting midsternal chest pain unrelated to exertion duration is typically less than 30 seconds   He is a history of bypass surgery. He also has history of sinus node dysfunction  most recent catheterization we have is 2006 demonstrating ejection fraction of 35-40% as well as a patent LIMA and vein graft to the posterior descending    Past Medical History  Diagnosis Date  . Sinus node dysfunction   . Ischemic cardiomyopathy   . S/P implantation of automatic cardioverter/defibrillator (AICD)     For the above-Medtronic EnTrust    Past Surgical History  Procedure Date  . Cardiac defibrillator placement     Medtronic EnTrust  . Coronary artery bypass graft     Current Outpatient Prescriptions  Medication Sig Dispense Refill  . amLODipine (NORVASC) 5 MG tablet Take by mouth as directed.        Marland Kitchen aspirin 325 MG EC tablet Take 325 mg by mouth daily.        . benazepril (LOTENSIN) 10 MG tablet Take 10 mg by mouth daily.        Marland Kitchen GLUCOSAMINE-CHONDROITIN PO Take by mouth 2 (two) times daily.      . metoprolol (LOPRESSOR) 50 MG tablet Take 25 mg by mouth 2 (two) times daily.        . Multiple Vitamins-Minerals (CENTRUM) tablet Take 1 tablet by mouth daily.        . NON FORMULARY 4 (four) times daily. Ultimate Joint repair      . vitamin B-12 (CYANOCOBALAMIN) 500 MCG tablet Take 500 mcg by mouth daily.          Allergies  Allergen Reactions  . Carvedilol     REACTION: multiple myalgias  . Celecoxib     Review of Systems negative except from HPI and PMH  Physical Exam BP 157/82  Pulse 62  Ht 5\' 10"  (1.778 m)  Wt 171 lb 1.9 oz (77.62 kg)  BMI 24.55 kg/m2 Well developed and well nourished in no  acute distress HENT normal E scleral and icterus clear Neck Supple JVP flat; carotids brisk and full Clear to ausculation Regular rate and rhythm, no murmurs +s4 Soft with active bowel sounds No clubbing cyanosis none Edema Alert and oriented, grossly normal motor and sensory function Skin Warm and Dry   Assessment and  Plan

## 2011-06-30 NOTE — Patient Instructions (Signed)
Follow up with Dr. Graciela Husbands as needed.  Continue monthly transmissions.

## 2011-06-30 NOTE — Assessment & Plan Note (Signed)
The patient's defibrillator is approaching ERI. We spent approximately 25 minutes discussing approaches to defibrillator generator replacement in an octogenarian with atrial fibrillation and who defibrillator benefit has been known to be markedly attenuated and 4 who in primary prevention there are retrospective data suggesting a lack of benefit. We also discussed the implications of his 6949-lead and the need for it to be replaced in the event that he chose a defibrillator as well as addressing the issue of sinus node dysfunction which prompts 75% atrial pacing.  We will continue transmissions at this time; the options I asked him and his wife to consider include #1-removal of the defibrillator and replacement with a pacemaker #2 removal of the defibrillator and if the vein is open replacing the defibrillator lead at the time of defibrillator generator replacement #3 removal of the defibrillator lead in the operating room if the left subclavian vein is occluded with replacement of the defibrillator system at that time #4 contralateral defibrillator implantation in the event that the left subclavian vein is occluded.  At this juncture he thinks that he would like to proceed with new lead implantation if the vein is opened and pacemaker replacement if the vein is occluded. When it comes time to undertake generator replacement, he will need a Myoview to exclude progressive ischemia

## 2011-06-30 NOTE — Assessment & Plan Note (Signed)
75% pacing in the atrium

## 2011-07-10 DIAGNOSIS — J13 Pneumonia due to Streptococcus pneumoniae: Secondary | ICD-10-CM | POA: Diagnosis not present

## 2011-07-10 DIAGNOSIS — Z6825 Body mass index (BMI) 25.0-25.9, adult: Secondary | ICD-10-CM | POA: Diagnosis not present

## 2011-07-10 DIAGNOSIS — B9789 Other viral agents as the cause of diseases classified elsewhere: Secondary | ICD-10-CM | POA: Diagnosis not present

## 2011-07-13 DIAGNOSIS — J13 Pneumonia due to Streptococcus pneumoniae: Secondary | ICD-10-CM | POA: Diagnosis not present

## 2011-07-17 ENCOUNTER — Encounter: Payer: Self-pay | Admitting: Internal Medicine

## 2011-07-23 ENCOUNTER — Encounter: Payer: Medicare Other | Admitting: *Deleted

## 2011-07-29 ENCOUNTER — Encounter: Payer: Self-pay | Admitting: *Deleted

## 2011-07-30 ENCOUNTER — Ambulatory Visit (INDEPENDENT_AMBULATORY_CARE_PROVIDER_SITE_OTHER): Payer: Medicare Other | Admitting: *Deleted

## 2011-07-30 ENCOUNTER — Encounter: Payer: Self-pay | Admitting: Internal Medicine

## 2011-07-30 DIAGNOSIS — Z95810 Presence of automatic (implantable) cardiac defibrillator: Secondary | ICD-10-CM | POA: Diagnosis not present

## 2011-07-30 DIAGNOSIS — I495 Sick sinus syndrome: Secondary | ICD-10-CM | POA: Diagnosis not present

## 2011-08-04 LAB — REMOTE ICD DEVICE
AL IMPEDENCE ICD: 440 Ohm
ATRIAL PACING ICD: 76.78 pct
BAMS-0001: 170 {beats}/min
BATTERY VOLTAGE: 2.62 V
CHARGE TIME: 13.042 s
DEV-0020ICD: NEGATIVE
PACEART VT: 0
TOT-0002: 1
TOT-0006: 20061227000000
TZAT-0001FASTVT: 1
TZAT-0001SLOWVT: 1
TZAT-0002ATACH: NEGATIVE
TZAT-0002ATACH: NEGATIVE
TZAT-0011SLOWVT: 10 ms
TZAT-0011SLOWVT: 10 ms
TZAT-0012FASTVT: 200 ms
TZAT-0013FASTVT: 1
TZAT-0018ATACH: NEGATIVE
TZAT-0018SLOWVT: NEGATIVE
TZAT-0018SLOWVT: NEGATIVE
TZAT-0019ATACH: 6 V
TZAT-0019ATACH: 6 V
TZAT-0019FASTVT: 8 V
TZAT-0019SLOWVT: 8 V
TZAT-0019SLOWVT: 8 V
TZAT-0020ATACH: 1.5 ms
TZAT-0020FASTVT: 1.5 ms
TZAT-0020SLOWVT: 1.5 ms
TZON-0005SLOWVT: 12
TZON-0010FASTVT: 30 ms
TZON-0010SLOWVT: 30 ms
TZST-0001ATACH: 5
TZST-0001FASTVT: 2
TZST-0001FASTVT: 4
TZST-0001FASTVT: 6
TZST-0001SLOWVT: 4
TZST-0002ATACH: NEGATIVE
TZST-0002ATACH: NEGATIVE
TZST-0003FASTVT: 25 J
TZST-0003FASTVT: 35 J
TZST-0003SLOWVT: 35 J
TZST-0003SLOWVT: 35 J
VENTRICULAR PACING ICD: 0.2 pct

## 2011-08-05 NOTE — Progress Notes (Signed)
Remote icd check  

## 2011-08-14 ENCOUNTER — Encounter: Payer: Self-pay | Admitting: *Deleted

## 2011-08-17 DIAGNOSIS — Y9269 Other specified industrial and construction area as the place of occurrence of the external cause: Secondary | ICD-10-CM | POA: Diagnosis not present

## 2011-08-17 DIAGNOSIS — I10 Essential (primary) hypertension: Secondary | ICD-10-CM | POA: Diagnosis not present

## 2011-08-17 DIAGNOSIS — I2510 Atherosclerotic heart disease of native coronary artery without angina pectoris: Secondary | ICD-10-CM | POA: Diagnosis not present

## 2011-08-17 DIAGNOSIS — W298XXA Contact with other powered powered hand tools and household machinery, initial encounter: Secondary | ICD-10-CM | POA: Diagnosis not present

## 2011-08-17 DIAGNOSIS — Z23 Encounter for immunization: Secondary | ICD-10-CM | POA: Diagnosis not present

## 2011-08-17 DIAGNOSIS — Z950 Presence of cardiac pacemaker: Secondary | ICD-10-CM | POA: Diagnosis not present

## 2011-08-17 DIAGNOSIS — S91009A Unspecified open wound, unspecified ankle, initial encounter: Secondary | ICD-10-CM | POA: Diagnosis not present

## 2011-08-17 DIAGNOSIS — S81809A Unspecified open wound, unspecified lower leg, initial encounter: Secondary | ICD-10-CM | POA: Diagnosis not present

## 2011-08-17 DIAGNOSIS — S81009A Unspecified open wound, unspecified knee, initial encounter: Secondary | ICD-10-CM | POA: Diagnosis not present

## 2011-08-24 DIAGNOSIS — N400 Enlarged prostate without lower urinary tract symptoms: Secondary | ICD-10-CM | POA: Diagnosis not present

## 2011-08-24 DIAGNOSIS — R972 Elevated prostate specific antigen [PSA]: Secondary | ICD-10-CM | POA: Diagnosis not present

## 2011-08-24 DIAGNOSIS — R339 Retention of urine, unspecified: Secondary | ICD-10-CM | POA: Diagnosis not present

## 2011-09-02 DIAGNOSIS — I10 Essential (primary) hypertension: Secondary | ICD-10-CM | POA: Diagnosis not present

## 2011-09-02 DIAGNOSIS — I2510 Atherosclerotic heart disease of native coronary artery without angina pectoris: Secondary | ICD-10-CM | POA: Diagnosis not present

## 2011-09-02 DIAGNOSIS — E119 Type 2 diabetes mellitus without complications: Secondary | ICD-10-CM | POA: Diagnosis not present

## 2011-09-02 DIAGNOSIS — E785 Hyperlipidemia, unspecified: Secondary | ICD-10-CM | POA: Diagnosis not present

## 2011-09-03 ENCOUNTER — Encounter: Payer: Self-pay | Admitting: Internal Medicine

## 2011-09-03 ENCOUNTER — Ambulatory Visit (INDEPENDENT_AMBULATORY_CARE_PROVIDER_SITE_OTHER): Payer: Medicare Other | Admitting: *Deleted

## 2011-09-03 DIAGNOSIS — I495 Sick sinus syndrome: Secondary | ICD-10-CM | POA: Diagnosis not present

## 2011-09-03 DIAGNOSIS — I2589 Other forms of chronic ischemic heart disease: Secondary | ICD-10-CM | POA: Diagnosis not present

## 2011-09-07 DIAGNOSIS — H2510 Age-related nuclear cataract, unspecified eye: Secondary | ICD-10-CM | POA: Diagnosis not present

## 2011-09-08 LAB — REMOTE ICD DEVICE
AL AMPLITUDE: 1.8 mv
AL IMPEDENCE ICD: 424 Ohm
ATRIAL PACING ICD: 85.73 pct
BAMS-0001: 170 {beats}/min
BATTERY VOLTAGE: 2.62 V
CHARGE TIME: 13.042 s
DEV-0020ICD: NEGATIVE
FVT: 0
PACEART VT: 0
RV LEAD AMPLITUDE: 5.1 mv
RV LEAD IMPEDENCE ICD: 400 Ohm
TOT-0001: 2
TOT-0002: 1
TOT-0006: 20061227000000
TZAT-0001ATACH: 1
TZAT-0001ATACH: 2
TZAT-0001ATACH: 3
TZAT-0001FASTVT: 1
TZAT-0001SLOWVT: 1
TZAT-0001SLOWVT: 2
TZAT-0002ATACH: NEGATIVE
TZAT-0002ATACH: NEGATIVE
TZAT-0002ATACH: NEGATIVE
TZAT-0004FASTVT: 8
TZAT-0004SLOWVT: 8
TZAT-0004SLOWVT: 8
TZAT-0005FASTVT: 88 pct
TZAT-0005SLOWVT: 84 pct
TZAT-0005SLOWVT: 91 pct
TZAT-0011FASTVT: 10 ms
TZAT-0011SLOWVT: 10 ms
TZAT-0011SLOWVT: 10 ms
TZAT-0012ATACH: 150 ms
TZAT-0012ATACH: 150 ms
TZAT-0012ATACH: 150 ms
TZAT-0012FASTVT: 200 ms
TZAT-0012SLOWVT: 200 ms
TZAT-0012SLOWVT: 200 ms
TZAT-0013FASTVT: 1
TZAT-0013SLOWVT: 2
TZAT-0013SLOWVT: 2
TZAT-0018ATACH: NEGATIVE
TZAT-0018ATACH: NEGATIVE
TZAT-0018ATACH: NEGATIVE
TZAT-0018FASTVT: NEGATIVE
TZAT-0018SLOWVT: NEGATIVE
TZAT-0018SLOWVT: NEGATIVE
TZAT-0019ATACH: 6 V
TZAT-0019ATACH: 6 V
TZAT-0019ATACH: 6 V
TZAT-0019FASTVT: 8 V
TZAT-0019SLOWVT: 8 V
TZAT-0019SLOWVT: 8 V
TZAT-0020ATACH: 1.5 ms
TZAT-0020ATACH: 1.5 ms
TZAT-0020ATACH: 1.5 ms
TZAT-0020FASTVT: 1.5 ms
TZAT-0020SLOWVT: 1.5 ms
TZAT-0020SLOWVT: 1.5 ms
TZON-0003ATACH: 350 ms
TZON-0003FASTVT: 240 ms
TZON-0003SLOWVT: 370 ms
TZON-0003VSLOWVT: 450 ms
TZON-0004SLOWVT: 16
TZON-0004VSLOWVT: 20
TZON-0005SLOWVT: 12
TZON-0010FASTVT: 30 ms
TZON-0010SLOWVT: 30 ms
TZST-0001ATACH: 4
TZST-0001ATACH: 5
TZST-0001ATACH: 6
TZST-0001FASTVT: 2
TZST-0001FASTVT: 3
TZST-0001FASTVT: 4
TZST-0001FASTVT: 5
TZST-0001FASTVT: 6
TZST-0001SLOWVT: 3
TZST-0001SLOWVT: 4
TZST-0001SLOWVT: 5
TZST-0001SLOWVT: 6
TZST-0002ATACH: NEGATIVE
TZST-0002ATACH: NEGATIVE
TZST-0002ATACH: NEGATIVE
TZST-0003FASTVT: 15 J
TZST-0003FASTVT: 25 J
TZST-0003FASTVT: 35 J
TZST-0003FASTVT: 35 J
TZST-0003FASTVT: 35 J
TZST-0003SLOWVT: 15 J
TZST-0003SLOWVT: 25 J
TZST-0003SLOWVT: 35 J
TZST-0003SLOWVT: 35 J
VENTRICULAR PACING ICD: 0.48 pct
VF: 0

## 2011-09-11 NOTE — Progress Notes (Signed)
ICD remote 

## 2011-09-14 DIAGNOSIS — M25569 Pain in unspecified knee: Secondary | ICD-10-CM | POA: Diagnosis not present

## 2011-09-23 ENCOUNTER — Encounter: Payer: Self-pay | Admitting: *Deleted

## 2011-09-23 DIAGNOSIS — N400 Enlarged prostate without lower urinary tract symptoms: Secondary | ICD-10-CM | POA: Diagnosis not present

## 2011-09-23 DIAGNOSIS — R339 Retention of urine, unspecified: Secondary | ICD-10-CM | POA: Diagnosis not present

## 2011-10-08 ENCOUNTER — Ambulatory Visit (INDEPENDENT_AMBULATORY_CARE_PROVIDER_SITE_OTHER): Payer: Medicare Other | Admitting: *Deleted

## 2011-10-08 ENCOUNTER — Encounter: Payer: Self-pay | Admitting: Internal Medicine

## 2011-10-08 DIAGNOSIS — Z95810 Presence of automatic (implantable) cardiac defibrillator: Secondary | ICD-10-CM

## 2011-10-08 DIAGNOSIS — IMO0002 Reserved for concepts with insufficient information to code with codable children: Secondary | ICD-10-CM | POA: Diagnosis not present

## 2011-10-08 DIAGNOSIS — I6789 Other cerebrovascular disease: Secondary | ICD-10-CM | POA: Diagnosis not present

## 2011-10-08 DIAGNOSIS — G510 Bell's palsy: Secondary | ICD-10-CM | POA: Diagnosis not present

## 2011-10-08 DIAGNOSIS — I2589 Other forms of chronic ischemic heart disease: Secondary | ICD-10-CM

## 2011-10-09 DIAGNOSIS — G510 Bell's palsy: Secondary | ICD-10-CM | POA: Diagnosis not present

## 2011-10-09 DIAGNOSIS — G8190 Hemiplegia, unspecified affecting unspecified side: Secondary | ICD-10-CM | POA: Diagnosis not present

## 2011-10-09 DIAGNOSIS — I6350 Cerebral infarction due to unspecified occlusion or stenosis of unspecified cerebral artery: Secondary | ICD-10-CM | POA: Diagnosis not present

## 2011-10-13 DIAGNOSIS — G519 Disorder of facial nerve, unspecified: Secondary | ICD-10-CM | POA: Diagnosis not present

## 2011-10-14 LAB — REMOTE ICD DEVICE
AL IMPEDENCE ICD: 400 Ohm
BAMS-0001: 170 {beats}/min
BATTERY VOLTAGE: 2.62 V
CHARGE TIME: 13.042 s
DEV-0020ICD: NEGATIVE
PACEART VT: 0
RV LEAD AMPLITUDE: 4.7398 mv
TOT-0002: 1
TOT-0006: 20061227000000
TZAT-0001ATACH: 1
TZAT-0001ATACH: 2
TZAT-0002ATACH: NEGATIVE
TZAT-0002ATACH: NEGATIVE
TZAT-0004FASTVT: 8
TZAT-0005SLOWVT: 84 pct
TZAT-0005SLOWVT: 91 pct
TZAT-0011FASTVT: 10 ms
TZAT-0011SLOWVT: 10 ms
TZAT-0011SLOWVT: 10 ms
TZAT-0012FASTVT: 200 ms
TZAT-0018ATACH: NEGATIVE
TZAT-0018ATACH: NEGATIVE
TZAT-0018SLOWVT: NEGATIVE
TZAT-0018SLOWVT: NEGATIVE
TZAT-0019ATACH: 6 V
TZAT-0019ATACH: 6 V
TZAT-0020ATACH: 1.5 ms
TZON-0005SLOWVT: 12
TZST-0001ATACH: 5
TZST-0001FASTVT: 3
TZST-0001FASTVT: 4
TZST-0001FASTVT: 5
TZST-0001SLOWVT: 4
TZST-0001SLOWVT: 6
TZST-0003FASTVT: 15 J
TZST-0003FASTVT: 35 J
TZST-0003SLOWVT: 15 J
TZST-0003SLOWVT: 35 J
VF: 0

## 2011-10-21 ENCOUNTER — Encounter: Payer: Self-pay | Admitting: *Deleted

## 2011-11-12 ENCOUNTER — Encounter: Payer: Medicare Other | Admitting: *Deleted

## 2011-11-20 ENCOUNTER — Ambulatory Visit (INDEPENDENT_AMBULATORY_CARE_PROVIDER_SITE_OTHER): Payer: Medicare Other | Admitting: *Deleted

## 2011-11-20 ENCOUNTER — Encounter: Payer: Self-pay | Admitting: Internal Medicine

## 2011-11-20 ENCOUNTER — Encounter: Payer: Self-pay | Admitting: *Deleted

## 2011-11-20 DIAGNOSIS — I2589 Other forms of chronic ischemic heart disease: Secondary | ICD-10-CM | POA: Diagnosis not present

## 2011-11-23 DIAGNOSIS — R339 Retention of urine, unspecified: Secondary | ICD-10-CM | POA: Diagnosis not present

## 2011-11-23 DIAGNOSIS — R972 Elevated prostate specific antigen [PSA]: Secondary | ICD-10-CM | POA: Diagnosis not present

## 2011-11-23 LAB — REMOTE ICD DEVICE
BAMS-0001: 170 {beats}/min
BATTERY VOLTAGE: 2.62 V
CHARGE TIME: 14.934 s
DEV-0020ICD: NEGATIVE
FVT: 0
PACEART VT: 0
RV LEAD AMPLITUDE: 3.4 mv
TOT-0002: 1
TZAT-0001ATACH: 1
TZAT-0002ATACH: NEGATIVE
TZAT-0002ATACH: NEGATIVE
TZAT-0002ATACH: NEGATIVE
TZAT-0005SLOWVT: 84 pct
TZAT-0005SLOWVT: 91 pct
TZAT-0011FASTVT: 10 ms
TZAT-0011SLOWVT: 10 ms
TZAT-0011SLOWVT: 10 ms
TZAT-0012FASTVT: 200 ms
TZAT-0012SLOWVT: 200 ms
TZAT-0013FASTVT: 1
TZAT-0013SLOWVT: 2
TZAT-0013SLOWVT: 2
TZAT-0018ATACH: NEGATIVE
TZAT-0018ATACH: NEGATIVE
TZAT-0018FASTVT: NEGATIVE
TZAT-0018SLOWVT: NEGATIVE
TZAT-0018SLOWVT: NEGATIVE
TZAT-0019ATACH: 6 V
TZAT-0019ATACH: 6 V
TZAT-0019FASTVT: 8 V
TZAT-0019SLOWVT: 8 V
TZAT-0019SLOWVT: 8 V
TZAT-0020ATACH: 1.5 ms
TZAT-0020ATACH: 1.5 ms
TZAT-0020FASTVT: 1.5 ms
TZON-0003FASTVT: 240 ms
TZON-0004SLOWVT: 16
TZON-0005SLOWVT: 12
TZON-0010FASTVT: 30 ms
TZST-0001ATACH: 5
TZST-0001FASTVT: 3
TZST-0001FASTVT: 4
TZST-0001FASTVT: 6
TZST-0001SLOWVT: 4
TZST-0001SLOWVT: 6
TZST-0002ATACH: NEGATIVE
TZST-0002ATACH: NEGATIVE
TZST-0003FASTVT: 25 J
TZST-0003FASTVT: 35 J
TZST-0003SLOWVT: 25 J
TZST-0003SLOWVT: 35 J
TZST-0003SLOWVT: 35 J
VENTRICULAR PACING ICD: 1.31 pct
VF: 0

## 2011-12-17 DIAGNOSIS — D649 Anemia, unspecified: Secondary | ICD-10-CM | POA: Diagnosis not present

## 2011-12-17 DIAGNOSIS — M129 Arthropathy, unspecified: Secondary | ICD-10-CM | POA: Diagnosis not present

## 2011-12-17 DIAGNOSIS — IMO0002 Reserved for concepts with insufficient information to code with codable children: Secondary | ICD-10-CM | POA: Diagnosis not present

## 2011-12-17 DIAGNOSIS — I10 Essential (primary) hypertension: Secondary | ICD-10-CM | POA: Diagnosis not present

## 2011-12-17 DIAGNOSIS — R000 Tachycardia, unspecified: Secondary | ICD-10-CM | POA: Diagnosis not present

## 2011-12-17 DIAGNOSIS — E785 Hyperlipidemia, unspecified: Secondary | ICD-10-CM | POA: Diagnosis not present

## 2011-12-17 DIAGNOSIS — I2510 Atherosclerotic heart disease of native coronary artery without angina pectoris: Secondary | ICD-10-CM | POA: Diagnosis not present

## 2011-12-17 DIAGNOSIS — E119 Type 2 diabetes mellitus without complications: Secondary | ICD-10-CM | POA: Diagnosis not present

## 2011-12-17 DIAGNOSIS — G519 Disorder of facial nerve, unspecified: Secondary | ICD-10-CM | POA: Diagnosis not present

## 2011-12-21 ENCOUNTER — Encounter: Payer: Medicare Other | Admitting: *Deleted

## 2011-12-29 ENCOUNTER — Encounter: Payer: Self-pay | Admitting: *Deleted

## 2012-01-05 ENCOUNTER — Ambulatory Visit (INDEPENDENT_AMBULATORY_CARE_PROVIDER_SITE_OTHER): Payer: Medicare Other | Admitting: *Deleted

## 2012-01-05 ENCOUNTER — Encounter: Payer: Self-pay | Admitting: Internal Medicine

## 2012-01-05 DIAGNOSIS — Z95810 Presence of automatic (implantable) cardiac defibrillator: Secondary | ICD-10-CM | POA: Diagnosis not present

## 2012-01-05 DIAGNOSIS — I2589 Other forms of chronic ischemic heart disease: Secondary | ICD-10-CM

## 2012-01-05 DIAGNOSIS — H2510 Age-related nuclear cataract, unspecified eye: Secondary | ICD-10-CM | POA: Diagnosis not present

## 2012-01-08 LAB — REMOTE ICD DEVICE
ATRIAL PACING ICD: 88 pct
CHARGE TIME: 14.934 s
PACEART VT: 0
TOT-0001: 2
TOT-0002: 1
TZAT-0001ATACH: 1
TZAT-0001ATACH: 2
TZAT-0004FASTVT: 8
TZAT-0004SLOWVT: 8
TZAT-0004SLOWVT: 8
TZAT-0005FASTVT: 88 pct
TZAT-0005SLOWVT: 91 pct
TZAT-0011FASTVT: 10 ms
TZAT-0011SLOWVT: 10 ms
TZAT-0011SLOWVT: 10 ms
TZAT-0012ATACH: 150 ms
TZAT-0012ATACH: 150 ms
TZAT-0012SLOWVT: 200 ms
TZAT-0012SLOWVT: 200 ms
TZAT-0013FASTVT: 1
TZAT-0013SLOWVT: 2
TZAT-0013SLOWVT: 2
TZAT-0018ATACH: NEGATIVE
TZAT-0018FASTVT: NEGATIVE
TZAT-0020ATACH: 1.5 ms
TZAT-0020ATACH: 1.5 ms
TZAT-0020ATACH: 1.5 ms
TZON-0003ATACH: 350 ms
TZON-0003SLOWVT: 370 ms
TZON-0003VSLOWVT: 450 ms
TZON-0004SLOWVT: 16
TZON-0010FASTVT: 30 ms
TZST-0001ATACH: 4
TZST-0001FASTVT: 2
TZST-0001FASTVT: 3
TZST-0001FASTVT: 5
TZST-0001SLOWVT: 4
TZST-0001SLOWVT: 5
TZST-0001SLOWVT: 6
TZST-0003FASTVT: 25 J
TZST-0003FASTVT: 35 J
TZST-0003FASTVT: 35 J
TZST-0003SLOWVT: 15 J
TZST-0003SLOWVT: 35 J
VENTRICULAR PACING ICD: 0.48 pct

## 2012-01-12 DIAGNOSIS — I10 Essential (primary) hypertension: Secondary | ICD-10-CM | POA: Diagnosis not present

## 2012-01-12 DIAGNOSIS — I2510 Atherosclerotic heart disease of native coronary artery without angina pectoris: Secondary | ICD-10-CM | POA: Diagnosis not present

## 2012-01-12 DIAGNOSIS — E119 Type 2 diabetes mellitus without complications: Secondary | ICD-10-CM | POA: Diagnosis not present

## 2012-01-13 ENCOUNTER — Encounter: Payer: Self-pay | Admitting: *Deleted

## 2012-01-21 DIAGNOSIS — I2510 Atherosclerotic heart disease of native coronary artery without angina pectoris: Secondary | ICD-10-CM | POA: Diagnosis not present

## 2012-01-26 DIAGNOSIS — H259 Unspecified age-related cataract: Secondary | ICD-10-CM | POA: Diagnosis not present

## 2012-01-26 DIAGNOSIS — H269 Unspecified cataract: Secondary | ICD-10-CM | POA: Diagnosis not present

## 2012-01-26 DIAGNOSIS — H2510 Age-related nuclear cataract, unspecified eye: Secondary | ICD-10-CM | POA: Diagnosis not present

## 2012-02-08 ENCOUNTER — Encounter: Payer: Medicare Other | Admitting: *Deleted

## 2012-02-09 DIAGNOSIS — H269 Unspecified cataract: Secondary | ICD-10-CM | POA: Diagnosis not present

## 2012-02-09 DIAGNOSIS — H2510 Age-related nuclear cataract, unspecified eye: Secondary | ICD-10-CM | POA: Diagnosis not present

## 2012-02-09 DIAGNOSIS — H259 Unspecified age-related cataract: Secondary | ICD-10-CM | POA: Diagnosis not present

## 2012-02-17 ENCOUNTER — Encounter: Payer: Self-pay | Admitting: *Deleted

## 2012-02-20 ENCOUNTER — Encounter: Payer: Self-pay | Admitting: Internal Medicine

## 2012-02-22 ENCOUNTER — Ambulatory Visit (INDEPENDENT_AMBULATORY_CARE_PROVIDER_SITE_OTHER): Payer: Medicare Other | Admitting: *Deleted

## 2012-02-22 DIAGNOSIS — Z95810 Presence of automatic (implantable) cardiac defibrillator: Secondary | ICD-10-CM | POA: Diagnosis not present

## 2012-02-22 DIAGNOSIS — I2589 Other forms of chronic ischemic heart disease: Secondary | ICD-10-CM

## 2012-02-23 DIAGNOSIS — N400 Enlarged prostate without lower urinary tract symptoms: Secondary | ICD-10-CM | POA: Diagnosis not present

## 2012-02-23 DIAGNOSIS — R339 Retention of urine, unspecified: Secondary | ICD-10-CM | POA: Diagnosis not present

## 2012-02-23 DIAGNOSIS — R972 Elevated prostate specific antigen [PSA]: Secondary | ICD-10-CM | POA: Diagnosis not present

## 2012-02-24 ENCOUNTER — Telehealth: Payer: Self-pay | Admitting: *Deleted

## 2012-02-24 NOTE — Telephone Encounter (Signed)
[name] received a letter from [facility] about his device. States he has never been to [place] and does not know where [place] is. He would appreciate a telephone call in regards to this letter. Will send note to [place].

## 2012-02-24 NOTE — Telephone Encounter (Signed)
Spoke w/pt in regards to letter. Pt sent in transmission on 02-20-12 showing battery voltage is at 2.61 V which reads ERI. Pt scheduled with [facility] for 03-10-12 @ 900 and pt aware of appt. Letter sent to pt from [facility] office was a mistake. Pt aware of this as well.

## 2012-02-26 LAB — REMOTE ICD DEVICE
ATRIAL PACING ICD: 84.56 pct
DEV-0020ICD: NEGATIVE
FVT: 0
TZAT-0001ATACH: 3
TZAT-0001FASTVT: 1
TZAT-0002ATACH: NEGATIVE
TZAT-0005FASTVT: 88 pct
TZAT-0011SLOWVT: 10 ms
TZAT-0011SLOWVT: 10 ms
TZAT-0012ATACH: 150 ms
TZAT-0012ATACH: 150 ms
TZAT-0012ATACH: 150 ms
TZAT-0012SLOWVT: 200 ms
TZAT-0012SLOWVT: 200 ms
TZAT-0013FASTVT: 1
TZAT-0013SLOWVT: 2
TZAT-0013SLOWVT: 2
TZAT-0018ATACH: NEGATIVE
TZAT-0018FASTVT: NEGATIVE
TZAT-0018SLOWVT: NEGATIVE
TZAT-0019ATACH: 6 V
TZAT-0019SLOWVT: 8 V
TZAT-0019SLOWVT: 8 V
TZAT-0020ATACH: 1.5 ms
TZAT-0020ATACH: 1.5 ms
TZAT-0020ATACH: 1.5 ms
TZAT-0020SLOWVT: 1.5 ms
TZAT-0020SLOWVT: 1.5 ms
TZON-0003ATACH: 350 ms
TZON-0003FASTVT: 240 ms
TZON-0003SLOWVT: 370 ms
TZON-0003VSLOWVT: 450 ms
TZON-0004SLOWVT: 16
TZON-0005SLOWVT: 12
TZON-0010FASTVT: 30 ms
TZON-0010SLOWVT: 30 ms
TZST-0001ATACH: 4
TZST-0001ATACH: 5
TZST-0001ATACH: 6
TZST-0001FASTVT: 2
TZST-0001FASTVT: 5
TZST-0001SLOWVT: 4
TZST-0001SLOWVT: 5
TZST-0002ATACH: NEGATIVE
TZST-0002ATACH: NEGATIVE
TZST-0003FASTVT: 25 J
TZST-0003FASTVT: 35 J
TZST-0003FASTVT: 35 J
TZST-0003SLOWVT: 25 J
TZST-0003SLOWVT: 35 J
VENTRICULAR PACING ICD: 0.34 pct
VF: 0

## 2012-03-01 DIAGNOSIS — Z23 Encounter for immunization: Secondary | ICD-10-CM | POA: Diagnosis not present

## 2012-03-02 ENCOUNTER — Encounter: Payer: Self-pay | Admitting: Internal Medicine

## 2012-03-02 ENCOUNTER — Ambulatory Visit (INDEPENDENT_AMBULATORY_CARE_PROVIDER_SITE_OTHER): Payer: Medicare Other | Admitting: Internal Medicine

## 2012-03-02 ENCOUNTER — Encounter: Payer: Self-pay | Admitting: *Deleted

## 2012-03-02 VITALS — BP 136/78 | HR 63 | Resp 18 | Ht 70.0 in | Wt 174.0 lb

## 2012-03-02 DIAGNOSIS — I495 Sick sinus syndrome: Secondary | ICD-10-CM

## 2012-03-02 DIAGNOSIS — Z95810 Presence of automatic (implantable) cardiac defibrillator: Secondary | ICD-10-CM | POA: Diagnosis not present

## 2012-03-02 NOTE — Assessment & Plan Note (Signed)
With frequent atrial pacing revision of his device to a pacemaker is appropriate.

## 2012-03-02 NOTE — Patient Instructions (Addendum)
Please follow instruction sheet given 

## 2012-03-02 NOTE — Progress Notes (Signed)
Patient Care Team: John E. Gage, MD as PCP - General (Unknown Physician Specialty)   HPI  [name] is a 78 y.o. male Seen in followup for ICD now at [facility]. It was implanted for ischemic heart disease. He has a history of bypass surgery and underwent catheterization most recently 2006 demonstrating ejection fraction of 35-40% with patent LIMA and patent vein graft to his posterior descending.  His device is associated with a 6949-lead  The patient denies chest pain, shortness of breath, nocturnal dyspnea, orthopnea or peripheral edema.  There have been no palpitations, lightheadedness or syncope.    Past Medical History  Diagnosis Date  . Sinus node dysfunction   . Ischemic cardiomyopathy   . S/P implantation of automatic cardioverter/defibrillator (AICD)     For the above-Medtronic EnTrust    Past Surgical History  Procedure Date  . Cardiac defibrillator placement     Medtronic EnTrust  . Coronary artery bypass graft     Current Outpatient Prescriptions  Medication Sig Dispense Refill  . amLODipine (NORVASC) 5 MG tablet Take by mouth as directed.        . benazepril (LOTENSIN) 10 MG tablet Take 10 mg by mouth daily.        . bethanechol (URECHOLINE) 50 MG tablet Take 1 tablet by mouth Twice daily.      . clopidogrel (PLAVIX) 75 MG tablet Take 1 tablet by mouth daily.      . EPITOL 200 MG tablet Take 1 tablet by mouth Twice daily.      . GLUCOSAMINE-CHONDROITIN PO Take by mouth 2 (two) times daily.      . ibuprofen (ADVIL,MOTRIN) 200 MG tablet Take 200 mg by mouth every 6 (six) hours as needed.      . metoprolol (LOPRESSOR) 50 MG tablet Take 25 mg by mouth 2 (two) times daily.        . Multiple Vitamins-Minerals (CENTRUM) tablet Take 1 tablet by mouth daily.        . prednisoLONE acetate (PRED FORTE) 1 % ophthalmic suspension Dose pack      . Tamsulosin HCl (FLOMAX) 0.4 MG CAPS Take 1 tablet by mouth daily.      . traMADol (ULTRAM) 50 MG tablet as needed.      . vitamin  B-12 (CYANOCOBALAMIN) 500 MCG tablet Take 500 mcg by mouth daily.          Allergies  Allergen Reactions  . Carvedilol     REACTION: multiple myalgias  . Celecoxib     Review of Systems negative except from HPI and PMH  Physical Exam BP 136/78  Pulse 63  Resp 18  Ht 5' 10" (1.778 m)  Wt 174 lb (78.926 kg)  BMI 24.97 kg/m2 Well developed and well nourished in no acute distress HENT normal E scleral and icterus clear Neck Supple JVP flat; carotids brisk and full Clear to ausculation  *Regular rate and rhythm, no murmurs gallops or rub Soft with active bowel sounds No clubbing cyanosis none Edema Alert and oriented, grossly normal motor and sensory function Skin Warm and Dry    Assessment and  Plan  

## 2012-03-02 NOTE — Assessment & Plan Note (Signed)
The patient has a ICD implanted for primary prevention associated with a 6949-lead. We have had a greater than 30 minute discussion regarding the issues related to generator replacement of an ICD. These include changes in the mode of dying, the lack of data as to utility in octogenarians, the associated need for replacing a 6949-lead because of his tendency to fracture. He is thought about these over the intervening months sinceour last visit and he would like to proceed with pacemaker generator replacement . Ventricular lead issues should be in material as he paces less than 1% of the time. We have reviewed potential benefits and risks including but not limited to infection he understands and is willing to proceed. His family is in agreement with this decision

## 2012-03-10 ENCOUNTER — Encounter (HOSPITAL_COMMUNITY): Payer: Self-pay | Admitting: Pharmacy Technician

## 2012-03-10 ENCOUNTER — Encounter: Payer: Medicare Other | Admitting: Internal Medicine

## 2012-03-17 ENCOUNTER — Other Ambulatory Visit (INDEPENDENT_AMBULATORY_CARE_PROVIDER_SITE_OTHER): Payer: Medicare Other

## 2012-03-17 DIAGNOSIS — Z95810 Presence of automatic (implantable) cardiac defibrillator: Secondary | ICD-10-CM | POA: Diagnosis not present

## 2012-03-17 DIAGNOSIS — I495 Sick sinus syndrome: Secondary | ICD-10-CM

## 2012-03-17 LAB — CBC WITH DIFFERENTIAL/PLATELET
Basophils Relative: 0.4 % (ref 0.0–3.0)
Eosinophils Absolute: 0.1 10*3/uL (ref 0.0–0.7)
Lymphs Abs: 1.8 10*3/uL (ref 0.7–4.0)
MCHC: 33.5 g/dL (ref 30.0–36.0)
MCV: 95.8 fl (ref 78.0–100.0)
Monocytes Absolute: 0.6 10*3/uL (ref 0.1–1.0)
Neutrophils Relative %: 60 % (ref 43.0–77.0)
Platelets: 211 10*3/uL (ref 150.0–400.0)

## 2012-03-17 LAB — BASIC METABOLIC PANEL
BUN: 14 mg/dL (ref 6–23)
CO2: 28 mEq/L (ref 19–32)
Chloride: 95 mEq/L — ABNORMAL LOW (ref 96–112)
Creatinine, Ser: 0.9 mg/dL (ref 0.4–1.5)

## 2012-03-18 DIAGNOSIS — N401 Enlarged prostate with lower urinary tract symptoms: Secondary | ICD-10-CM | POA: Diagnosis not present

## 2012-03-18 DIAGNOSIS — G500 Trigeminal neuralgia: Secondary | ICD-10-CM | POA: Diagnosis not present

## 2012-03-18 DIAGNOSIS — E785 Hyperlipidemia, unspecified: Secondary | ICD-10-CM | POA: Diagnosis not present

## 2012-03-18 DIAGNOSIS — E119 Type 2 diabetes mellitus without complications: Secondary | ICD-10-CM | POA: Diagnosis not present

## 2012-03-18 DIAGNOSIS — I10 Essential (primary) hypertension: Secondary | ICD-10-CM | POA: Diagnosis not present

## 2012-03-18 DIAGNOSIS — I2510 Atherosclerotic heart disease of native coronary artery without angina pectoris: Secondary | ICD-10-CM | POA: Diagnosis not present

## 2012-03-22 ENCOUNTER — Telehealth: Payer: Self-pay | Admitting: Internal Medicine

## 2012-03-22 NOTE — Telephone Encounter (Signed)
plz return call to pt [phone] after 2:30pm regarding instructions for upcoming defib change out.  Pt has not heard from anyone with instructions or pre-med for this procedure.

## 2012-03-22 NOTE — Telephone Encounter (Signed)
Pt reassured.

## 2012-03-23 DIAGNOSIS — Z951 Presence of aortocoronary bypass graft: Secondary | ICD-10-CM | POA: Diagnosis not present

## 2012-03-23 DIAGNOSIS — Z7902 Long term (current) use of antithrombotics/antiplatelets: Secondary | ICD-10-CM | POA: Diagnosis not present

## 2012-03-23 DIAGNOSIS — I2589 Other forms of chronic ischemic heart disease: Secondary | ICD-10-CM | POA: Diagnosis not present

## 2012-03-23 DIAGNOSIS — Z4502 Encounter for adjustment and management of automatic implantable cardiac defibrillator: Secondary | ICD-10-CM | POA: Diagnosis not present

## 2012-03-23 DIAGNOSIS — Z79899 Other long term (current) drug therapy: Secondary | ICD-10-CM | POA: Diagnosis not present

## 2012-03-23 MED ORDER — SODIUM CHLORIDE 0.9 % IR SOLN
80.0000 mg | Status: AC
  Filled 2012-03-23: qty 2

## 2012-03-23 MED ORDER — CEFAZOLIN SODIUM-DEXTROSE 2-3 GM-% IV SOLR
2.0000 g | INTRAVENOUS | Status: AC
  Filled 2012-03-23 (×2): qty 50

## 2012-03-24 ENCOUNTER — Encounter (HOSPITAL_COMMUNITY)
Admission: RE | Disposition: A | Discharge status: Home / Self Care | Payer: Self-pay | Source: Ambulatory Visit | Attending: Internal Medicine

## 2012-03-24 ENCOUNTER — Ambulatory Visit (HOSPITAL_COMMUNITY)
Admission: RE | Admit: 2012-03-24 | Discharge: 2012-03-24 | Disposition: A | Discharge status: Home / Self Care | Payer: Medicare Other | Source: Ambulatory Visit | Attending: Internal Medicine | Admitting: Internal Medicine

## 2012-03-24 DIAGNOSIS — Z7902 Long term (current) use of antithrombotics/antiplatelets: Secondary | ICD-10-CM | POA: Insufficient documentation

## 2012-03-24 DIAGNOSIS — Z79899 Other long term (current) drug therapy: Secondary | ICD-10-CM | POA: Insufficient documentation

## 2012-03-24 DIAGNOSIS — Z951 Presence of aortocoronary bypass graft: Secondary | ICD-10-CM | POA: Diagnosis not present

## 2012-03-24 DIAGNOSIS — Z4502 Encounter for adjustment and management of automatic implantable cardiac defibrillator: Secondary | ICD-10-CM | POA: Diagnosis not present

## 2012-03-24 DIAGNOSIS — I2589 Other forms of chronic ischemic heart disease: Secondary | ICD-10-CM | POA: Diagnosis not present

## 2012-03-24 DIAGNOSIS — Z95810 Presence of automatic (implantable) cardiac defibrillator: Secondary | ICD-10-CM

## 2012-03-24 DIAGNOSIS — I495 Sick sinus syndrome: Secondary | ICD-10-CM | POA: Diagnosis not present

## 2012-03-24 HISTORY — PX: IMPLANTABLE CARDIOVERTER DEFIBRILLATOR GENERATOR CHANGE: SHX5474

## 2012-03-24 LAB — SURGICAL PCR SCREEN: MRSA, PCR: NEGATIVE

## 2012-03-24 LAB — PROTIME-INR
INR: 1.17 (ref 0.00–1.49)
Prothrombin Time: 14.7 seconds (ref 11.6–15.2)

## 2012-03-24 SURGERY — IMPLANTABLE CARDIOVERTER DEFIBRILLATOR GENERATOR CHANGE
Anesthesia: LOCAL

## 2012-03-24 MED ORDER — SODIUM CHLORIDE 0.9 % IJ SOLN: 3.0000 mL | INTRAMUSCULAR | Status: DC | PRN

## 2012-03-24 MED ORDER — SODIUM CHLORIDE 0.45 % IV SOLN
INTRAVENOUS | Status: DC
  Administered 2012-03-24: 11:00:00 via INTRAVENOUS

## 2012-03-24 MED ORDER — FENTANYL CITRATE 0.05 MG/ML IJ SOLN
INTRAMUSCULAR | Status: AC
  Filled 2012-03-24: qty 2

## 2012-03-24 MED ORDER — MIDAZOLAM HCL 5 MG/5ML IJ SOLN
INTRAMUSCULAR | Status: AC
  Filled 2012-03-24: qty 5

## 2012-03-24 MED ORDER — SODIUM CHLORIDE 0.9 % IV SOLN: 250.0000 mL | INTRAVENOUS | Status: DC

## 2012-03-24 MED ORDER — SODIUM CHLORIDE 0.9 % IV SOLN: INTRAVENOUS | Status: DC

## 2012-03-24 MED ORDER — SODIUM CHLORIDE 0.9 % IJ SOLN: 3.0000 mL | Freq: Two times a day (BID) | INTRAMUSCULAR | Status: DC

## 2012-03-24 MED ORDER — CHLORHEXIDINE GLUCONATE 4 % EX LIQD: 60.0000 mL | Freq: Once | CUTANEOUS | Status: DC

## 2012-03-24 MED ORDER — ONDANSETRON HCL 4 MG/2ML IJ SOLN: 4.0000 mg | Freq: Four times a day (QID) | INTRAMUSCULAR | Status: DC | PRN

## 2012-03-24 MED ORDER — ACETAMINOPHEN 325 MG PO TABS: 325.0000 mg | ORAL_TABLET | ORAL | Status: DC | PRN

## 2012-03-24 MED ORDER — MUPIROCIN 2 % EX OINT
TOPICAL_OINTMENT | Freq: Two times a day (BID) | CUTANEOUS | Status: DC
  Administered 2012-03-24: 11:00:00 via NASAL

## 2012-03-24 MED ORDER — MUPIROCIN 2 % EX OINT
TOPICAL_OINTMENT | CUTANEOUS | Status: AC
  Filled 2012-03-24: qty 22

## 2012-03-24 NOTE — Op Note (Signed)
Preoperative diagnosis  Cardiomyopathy previous ICD now at [facility] Postoperative diagnosis same/  Procedure: Generator replacement with removal of ICD generator and insertion of pacemaker generator  Following informed consent the patient was brought to the electrophysiology laboratory in place of the fluoroscopic table in the supine position after routine prep and drape lidocaine was infiltrated in the region of the previous incision and carried down to later the device pocket using sharp dissection and electrocautery. The pocket was opened the device was freed up and was explanted.  Interrogation of the previously implanted ventricular lead Medtronic [phone]   demonstrated an R wave of 10.4 millivolts., and impedance of 552ohms, and a pacing threshold of 0.7 volts at 0.5 msec.    The previously implanted atrial lead [id number]  added P-wave amplitude of 3.6 illlivolts  and impedance of  475 ohms, and a pacing threshold of 0.5volts at 0.66milliseconds.  The leads were inspected. The leads were then attached to a Medtronic adapta L pulse generator, serial number [id number]  The ICD Highvoltage leads were cappped and fixed to the fascia below the pulse generator.    The pocket was irrigated with antibiotic containing saline solution hemostasis was assured and the leads and the device were placed in the pocket. The wound is then closed in 3 layers in normal fashion.  The patient tolerated the procedure without apparent complication.  Sherryl Manges

## 2012-03-24 NOTE — Interval H&P Note (Signed)
History and Physical Interval Note:  03/24/2012 10:45 AM  [name]  has presented today for surgery, with the diagnosis of asm/eri  The various methods of treatment have been discussed with the patient and family. After consideration of risks, benefits and other options for treatment, the patient has consented to  Procedure(s) (LRB) with comments: IMPLANTABLE CARDIOVERTER DEFIBRILLATOR GENERATOR CHANGE (N/A) as a surgical intervention .  The patient's history has been reviewed, patient examined, no change in status, stable for surgery.  I have reviewed the patient's chart and labs.  Questions were answered to the patient's satisfaction.     Sherryl Manges

## 2012-03-24 NOTE — H&P (View-Only) (Signed)
Patient Care Team: Thelma Comp, MD as PCP - General (Unknown Physician Specialty)   HPI  [name] is a 78 y.o. male Seen in followup for ICD now at [facility]. It was implanted for ischemic heart disease. He has a history of bypass surgery and underwent catheterization most recently 2006 demonstrating ejection fraction of 35-40% with patent LIMA and patent vein graft to his posterior descending.  His device is associated with a 6949-lead  The patient denies chest pain, shortness of breath, nocturnal dyspnea, orthopnea or peripheral edema.  There have been no palpitations, lightheadedness or syncope.    Past Medical History  Diagnosis Date  . Sinus node dysfunction   . Ischemic cardiomyopathy   . S/P implantation of automatic cardioverter/defibrillator (AICD)     For the above-Medtronic EnTrust    Past Surgical History  Procedure Date  . Cardiac defibrillator placement     Medtronic EnTrust  . Coronary artery bypass graft     Current Outpatient Prescriptions  Medication Sig Dispense Refill  . amLODipine (NORVASC) 5 MG tablet Take by mouth as directed.        . benazepril (LOTENSIN) 10 MG tablet Take 10 mg by mouth daily.        . bethanechol (URECHOLINE) 50 MG tablet Take 1 tablet by mouth Twice daily.      . clopidogrel (PLAVIX) 75 MG tablet Take 1 tablet by mouth daily.      . EPITOL 200 MG tablet Take 1 tablet by mouth Twice daily.      Marland Kitchen GLUCOSAMINE-CHONDROITIN PO Take by mouth 2 (two) times daily.      Marland Kitchen ibuprofen (ADVIL,MOTRIN) 200 MG tablet Take 200 mg by mouth every 6 (six) hours as needed.      . metoprolol (LOPRESSOR) 50 MG tablet Take 25 mg by mouth 2 (two) times daily.        . Multiple Vitamins-Minerals (CENTRUM) tablet Take 1 tablet by mouth daily.        . prednisoLONE acetate (PRED FORTE) 1 % ophthalmic suspension Dose pack      . Tamsulosin HCl (FLOMAX) 0.4 MG CAPS Take 1 tablet by mouth daily.      . traMADol (ULTRAM) 50 MG tablet as needed.      . vitamin  B-12 (CYANOCOBALAMIN) 500 MCG tablet Take 500 mcg by mouth daily.          Allergies  Allergen Reactions  . Carvedilol     REACTION: multiple myalgias  . Celecoxib     Review of Systems negative except from HPI and PMH  Physical Exam BP 136/78  Pulse 63  Resp 18  Ht 5\' 10"  (1.778 m)  Wt 174 lb (78.926 kg)  BMI 24.97 kg/m2 Well developed and well nourished in no acute distress HENT normal E scleral and icterus clear Neck Supple JVP flat; carotids brisk and full Clear to ausculation  *Regular rate and rhythm, no murmurs gallops or rub Soft with active bowel sounds No clubbing cyanosis none Edema Alert and oriented, grossly normal motor and sensory function Skin Warm and Dry    Assessment and  Plan

## 2012-03-25 ENCOUNTER — Encounter: Payer: Self-pay | Admitting: *Deleted

## 2012-03-25 ENCOUNTER — Telehealth: Payer: Self-pay | Admitting: Internal Medicine

## 2012-03-25 DIAGNOSIS — Z950 Presence of cardiac pacemaker: Secondary | ICD-10-CM | POA: Insufficient documentation

## 2012-03-25 NOTE — Telephone Encounter (Signed)
Spoke with pt, he had a defib change out yesterday and wonder how long he would need to wear the bandage. Pt made aware he can take the bandage off today but leave the steri strips alone. Pt voiced understanding.

## 2012-03-25 NOTE — Telephone Encounter (Signed)
plz return call to pt [phone], regarding wound care for pacer change out

## 2012-04-06 ENCOUNTER — Ambulatory Visit (INDEPENDENT_AMBULATORY_CARE_PROVIDER_SITE_OTHER): Payer: Medicare Other | Admitting: *Deleted

## 2012-04-06 ENCOUNTER — Encounter: Payer: Self-pay | Admitting: Internal Medicine

## 2012-04-06 DIAGNOSIS — I495 Sick sinus syndrome: Secondary | ICD-10-CM

## 2012-04-06 LAB — PACEMAKER DEVICE OBSERVATION
AL AMPLITUDE: 2.8 mv
ATRIAL PACING PM: 81
BAMS-0001: 150 {beats}/min
BATTERY VOLTAGE: 2.8 V
RV LEAD THRESHOLD: 0.75 V
VENTRICULAR PACING PM: 1

## 2012-04-06 NOTE — Patient Instructions (Addendum)
Send [facility] transmission 07/11/12.

## 2012-04-06 NOTE — Progress Notes (Signed)
Wound check-PPM 

## 2012-05-20 DIAGNOSIS — I2510 Atherosclerotic heart disease of native coronary artery without angina pectoris: Secondary | ICD-10-CM | POA: Diagnosis not present

## 2012-05-20 DIAGNOSIS — G500 Trigeminal neuralgia: Secondary | ICD-10-CM | POA: Diagnosis not present

## 2012-05-20 DIAGNOSIS — I10 Essential (primary) hypertension: Secondary | ICD-10-CM | POA: Diagnosis not present

## 2012-05-20 DIAGNOSIS — M25569 Pain in unspecified knee: Secondary | ICD-10-CM | POA: Diagnosis not present

## 2012-05-26 DIAGNOSIS — B9789 Other viral agents as the cause of diseases classified elsewhere: Secondary | ICD-10-CM | POA: Diagnosis not present

## 2012-05-26 DIAGNOSIS — J301 Allergic rhinitis due to pollen: Secondary | ICD-10-CM | POA: Diagnosis not present

## 2012-05-26 DIAGNOSIS — J189 Pneumonia, unspecified organism: Secondary | ICD-10-CM | POA: Diagnosis not present

## 2012-06-20 DIAGNOSIS — I2510 Atherosclerotic heart disease of native coronary artery without angina pectoris: Secondary | ICD-10-CM | POA: Diagnosis not present

## 2012-06-20 DIAGNOSIS — I10 Essential (primary) hypertension: Secondary | ICD-10-CM | POA: Diagnosis not present

## 2012-06-20 DIAGNOSIS — E785 Hyperlipidemia, unspecified: Secondary | ICD-10-CM | POA: Diagnosis not present

## 2012-06-20 DIAGNOSIS — E119 Type 2 diabetes mellitus without complications: Secondary | ICD-10-CM | POA: Diagnosis not present

## 2012-07-11 ENCOUNTER — Ambulatory Visit (INDEPENDENT_AMBULATORY_CARE_PROVIDER_SITE_OTHER): Payer: Medicare Other | Admitting: *Deleted

## 2012-07-11 ENCOUNTER — Other Ambulatory Visit: Payer: Self-pay

## 2012-07-11 DIAGNOSIS — I495 Sick sinus syndrome: Secondary | ICD-10-CM

## 2012-07-11 DIAGNOSIS — Z950 Presence of cardiac pacemaker: Secondary | ICD-10-CM | POA: Diagnosis not present

## 2012-07-24 LAB — REMOTE PACEMAKER DEVICE
AL IMPEDENCE PM: 465 Ohm
AL THRESHOLD: 0.5 V
ATRIAL PACING PM: 84
BAMS-0001: 150 {beats}/min
RV LEAD AMPLITUDE: 16 mv
RV LEAD IMPEDENCE PM: 483 Ohm
RV LEAD THRESHOLD: 0.75 V

## 2012-08-04 ENCOUNTER — Encounter: Payer: Self-pay | Admitting: *Deleted

## 2012-08-22 ENCOUNTER — Encounter: Payer: Self-pay | Admitting: Internal Medicine

## 2012-09-19 DIAGNOSIS — C189 Malignant neoplasm of colon, unspecified: Secondary | ICD-10-CM | POA: Diagnosis not present

## 2012-09-19 DIAGNOSIS — I428 Other cardiomyopathies: Secondary | ICD-10-CM | POA: Diagnosis not present

## 2012-09-19 DIAGNOSIS — E119 Type 2 diabetes mellitus without complications: Secondary | ICD-10-CM | POA: Diagnosis not present

## 2012-09-19 DIAGNOSIS — E785 Hyperlipidemia, unspecified: Secondary | ICD-10-CM | POA: Diagnosis not present

## 2012-09-19 DIAGNOSIS — I2510 Atherosclerotic heart disease of native coronary artery without angina pectoris: Secondary | ICD-10-CM | POA: Diagnosis not present

## 2012-10-17 ENCOUNTER — Ambulatory Visit (INDEPENDENT_AMBULATORY_CARE_PROVIDER_SITE_OTHER): Payer: Medicare Other | Admitting: *Deleted

## 2012-10-17 ENCOUNTER — Encounter: Payer: Self-pay | Admitting: Internal Medicine

## 2012-10-17 DIAGNOSIS — I495 Sick sinus syndrome: Secondary | ICD-10-CM

## 2012-10-17 DIAGNOSIS — Z950 Presence of cardiac pacemaker: Secondary | ICD-10-CM | POA: Diagnosis not present

## 2012-10-26 DIAGNOSIS — N401 Enlarged prostate with lower urinary tract symptoms: Secondary | ICD-10-CM | POA: Diagnosis not present

## 2012-10-26 DIAGNOSIS — R972 Elevated prostate specific antigen [PSA]: Secondary | ICD-10-CM | POA: Diagnosis not present

## 2012-11-01 LAB — REMOTE PACEMAKER DEVICE
AL IMPEDENCE PM: 459 Ohm
ATRIAL PACING PM: 92
BATTERY VOLTAGE: 2.8 V
RV LEAD IMPEDENCE PM: 525 Ohm
RV LEAD THRESHOLD: 0.75 V
VENTRICULAR PACING PM: 1

## 2012-11-10 ENCOUNTER — Encounter: Payer: Self-pay | Admitting: *Deleted

## 2012-11-15 DIAGNOSIS — M5137 Other intervertebral disc degeneration, lumbosacral region: Secondary | ICD-10-CM | POA: Diagnosis not present

## 2012-11-15 DIAGNOSIS — IMO0002 Reserved for concepts with insufficient information to code with codable children: Secondary | ICD-10-CM | POA: Diagnosis not present

## 2012-11-15 DIAGNOSIS — M545 Low back pain: Secondary | ICD-10-CM | POA: Diagnosis not present

## 2012-11-15 DIAGNOSIS — M999 Biomechanical lesion, unspecified: Secondary | ICD-10-CM | POA: Diagnosis not present

## 2012-11-17 DIAGNOSIS — M5137 Other intervertebral disc degeneration, lumbosacral region: Secondary | ICD-10-CM | POA: Diagnosis not present

## 2012-11-17 DIAGNOSIS — M999 Biomechanical lesion, unspecified: Secondary | ICD-10-CM | POA: Diagnosis not present

## 2012-11-24 DIAGNOSIS — M5137 Other intervertebral disc degeneration, lumbosacral region: Secondary | ICD-10-CM | POA: Diagnosis not present

## 2012-11-24 DIAGNOSIS — M999 Biomechanical lesion, unspecified: Secondary | ICD-10-CM | POA: Diagnosis not present

## 2012-11-28 DIAGNOSIS — M5380 Other specified dorsopathies, site unspecified: Secondary | ICD-10-CM | POA: Diagnosis not present

## 2012-11-28 DIAGNOSIS — R21 Rash and other nonspecific skin eruption: Secondary | ICD-10-CM | POA: Diagnosis not present

## 2012-12-01 DIAGNOSIS — M999 Biomechanical lesion, unspecified: Secondary | ICD-10-CM | POA: Diagnosis not present

## 2012-12-01 DIAGNOSIS — M5137 Other intervertebral disc degeneration, lumbosacral region: Secondary | ICD-10-CM | POA: Diagnosis not present

## 2012-12-02 DIAGNOSIS — L738 Other specified follicular disorders: Secondary | ICD-10-CM | POA: Diagnosis not present

## 2012-12-02 DIAGNOSIS — L570 Actinic keratosis: Secondary | ICD-10-CM | POA: Diagnosis not present

## 2012-12-02 DIAGNOSIS — R21 Rash and other nonspecific skin eruption: Secondary | ICD-10-CM | POA: Diagnosis not present

## 2012-12-08 DIAGNOSIS — M5137 Other intervertebral disc degeneration, lumbosacral region: Secondary | ICD-10-CM | POA: Diagnosis not present

## 2012-12-08 DIAGNOSIS — M999 Biomechanical lesion, unspecified: Secondary | ICD-10-CM | POA: Diagnosis not present

## 2012-12-13 ENCOUNTER — Encounter: Payer: Self-pay | Admitting: Internal Medicine

## 2012-12-21 DIAGNOSIS — I2510 Atherosclerotic heart disease of native coronary artery without angina pectoris: Secondary | ICD-10-CM | POA: Diagnosis not present

## 2012-12-21 DIAGNOSIS — E119 Type 2 diabetes mellitus without complications: Secondary | ICD-10-CM | POA: Diagnosis not present

## 2012-12-21 DIAGNOSIS — E785 Hyperlipidemia, unspecified: Secondary | ICD-10-CM | POA: Diagnosis not present

## 2012-12-21 DIAGNOSIS — I519 Heart disease, unspecified: Secondary | ICD-10-CM | POA: Diagnosis not present

## 2012-12-21 DIAGNOSIS — G519 Disorder of facial nerve, unspecified: Secondary | ICD-10-CM | POA: Diagnosis not present

## 2012-12-21 DIAGNOSIS — N401 Enlarged prostate with lower urinary tract symptoms: Secondary | ICD-10-CM | POA: Diagnosis not present

## 2012-12-22 DIAGNOSIS — M999 Biomechanical lesion, unspecified: Secondary | ICD-10-CM | POA: Diagnosis not present

## 2012-12-22 DIAGNOSIS — M5137 Other intervertebral disc degeneration, lumbosacral region: Secondary | ICD-10-CM | POA: Diagnosis not present

## 2012-12-29 DIAGNOSIS — M5137 Other intervertebral disc degeneration, lumbosacral region: Secondary | ICD-10-CM | POA: Diagnosis not present

## 2012-12-29 DIAGNOSIS — M999 Biomechanical lesion, unspecified: Secondary | ICD-10-CM | POA: Diagnosis not present

## 2013-01-19 DIAGNOSIS — M999 Biomechanical lesion, unspecified: Secondary | ICD-10-CM | POA: Diagnosis not present

## 2013-01-19 DIAGNOSIS — M5137 Other intervertebral disc degeneration, lumbosacral region: Secondary | ICD-10-CM | POA: Diagnosis not present

## 2013-01-27 DIAGNOSIS — T148 Other injury of unspecified body region: Secondary | ICD-10-CM | POA: Diagnosis not present

## 2013-01-30 ENCOUNTER — Ambulatory Visit (INDEPENDENT_AMBULATORY_CARE_PROVIDER_SITE_OTHER): Payer: Medicare Other | Admitting: *Deleted

## 2013-01-30 DIAGNOSIS — I495 Sick sinus syndrome: Secondary | ICD-10-CM | POA: Diagnosis not present

## 2013-01-30 LAB — REMOTE PACEMAKER DEVICE
AL IMPEDENCE PM: 446 Ohm
AL THRESHOLD: 0.375 V
ATRIAL PACING PM: 95
BAMS-0001: 150 {beats}/min
RV LEAD THRESHOLD: 0.875 V

## 2013-02-03 DIAGNOSIS — H26499 Other secondary cataract, unspecified eye: Secondary | ICD-10-CM | POA: Diagnosis not present

## 2013-02-20 DIAGNOSIS — M5137 Other intervertebral disc degeneration, lumbosacral region: Secondary | ICD-10-CM | POA: Diagnosis not present

## 2013-02-20 DIAGNOSIS — M999 Biomechanical lesion, unspecified: Secondary | ICD-10-CM | POA: Diagnosis not present

## 2013-02-22 ENCOUNTER — Encounter: Payer: Self-pay | Admitting: Internal Medicine

## 2013-02-27 DIAGNOSIS — N401 Enlarged prostate with lower urinary tract symptoms: Secondary | ICD-10-CM | POA: Diagnosis not present

## 2013-02-27 DIAGNOSIS — R339 Retention of urine, unspecified: Secondary | ICD-10-CM | POA: Diagnosis not present

## 2013-02-27 DIAGNOSIS — R972 Elevated prostate specific antigen [PSA]: Secondary | ICD-10-CM | POA: Diagnosis not present

## 2013-03-01 DIAGNOSIS — Z23 Encounter for immunization: Secondary | ICD-10-CM | POA: Diagnosis not present

## 2013-03-14 ENCOUNTER — Encounter: Payer: Medicare Other | Admitting: Internal Medicine

## 2013-03-14 ENCOUNTER — Encounter: Payer: Self-pay | Admitting: Internal Medicine

## 2013-03-24 DIAGNOSIS — E119 Type 2 diabetes mellitus without complications: Secondary | ICD-10-CM | POA: Diagnosis not present

## 2013-03-24 DIAGNOSIS — G519 Disorder of facial nerve, unspecified: Secondary | ICD-10-CM | POA: Diagnosis not present

## 2013-03-24 DIAGNOSIS — Z23 Encounter for immunization: Secondary | ICD-10-CM | POA: Diagnosis not present

## 2013-03-24 DIAGNOSIS — E785 Hyperlipidemia, unspecified: Secondary | ICD-10-CM | POA: Diagnosis not present

## 2013-03-24 DIAGNOSIS — N401 Enlarged prostate with lower urinary tract symptoms: Secondary | ICD-10-CM | POA: Diagnosis not present

## 2013-03-27 DIAGNOSIS — M5137 Other intervertebral disc degeneration, lumbosacral region: Secondary | ICD-10-CM | POA: Diagnosis not present

## 2013-03-27 DIAGNOSIS — M999 Biomechanical lesion, unspecified: Secondary | ICD-10-CM | POA: Diagnosis not present

## 2013-03-28 ENCOUNTER — Encounter: Payer: Self-pay | Admitting: Internal Medicine

## 2013-03-28 ENCOUNTER — Ambulatory Visit (INDEPENDENT_AMBULATORY_CARE_PROVIDER_SITE_OTHER): Payer: Medicare Other | Admitting: Internal Medicine

## 2013-03-28 VITALS — BP 117/72 | HR 65 | Ht 70.0 in | Wt 175.4 lb

## 2013-03-28 DIAGNOSIS — Z95810 Presence of automatic (implantable) cardiac defibrillator: Secondary | ICD-10-CM | POA: Diagnosis not present

## 2013-03-28 DIAGNOSIS — R29898 Other symptoms and signs involving the musculoskeletal system: Secondary | ICD-10-CM

## 2013-03-28 DIAGNOSIS — I428 Other cardiomyopathies: Secondary | ICD-10-CM | POA: Diagnosis not present

## 2013-03-28 DIAGNOSIS — I495 Sick sinus syndrome: Secondary | ICD-10-CM

## 2013-03-28 LAB — MDC_IDC_ENUM_SESS_TYPE_INCLINIC
Battery Impedance: 111 Ohm
Battery Remaining Longevity: 150 mo
Battery Voltage: 2.8 V
Date Time Interrogation Session: 20141125145936
Lead Channel Impedance Value: 459 Ohm
Lead Channel Pacing Threshold Amplitude: 0.5 V
Lead Channel Pacing Threshold Pulse Width: 0.4 ms
Lead Channel Setting Pacing Amplitude: 2.5 V
Lead Channel Setting Pacing Pulse Width: 0.4 ms
Lead Channel Setting Sensing Sensitivity: 4 mV

## 2013-03-28 NOTE — Progress Notes (Signed)
      Patient Care Team: Thelma Comp, MD as PCP - General (Unknown Physician Specialty)   HPI  [name] is a 79 y.o. male Seen in followup for ICD now at [facility]. It was implanted for ischemic heart disease. He has a history of bypass surgery and underwent catheterization most recently 2006 demonstrating ejection fraction of 35-40% with patent LIMA and patent vein graft to his posterior descending.   He underwent device generator replacement with downgrade to dual chamber pacemaker and capping high-voltage 6949-lead   The patient denies chest pain, shortness of breath, nocturnal dyspnea, orthopnea or peripheral edema. There have been no palpitations, lightheadedness or syncope.    Past Medical History  Diagnosis Date  . Sinus node dysfunction   . Ischemic cardiomyopathy   . S/P implantation of automatic cardioverter/defibrillator (AICD)     For the above-Medtronic EnTrust    Past Surgical History  Procedure Laterality Date  . Cardiac defibrillator placement      Medtronic EnTrust  . Coronary artery bypass graft      Current Outpatient Prescriptions  Medication Sig Dispense Refill  . amLODipine (NORVASC) 5 MG tablet Take 5 mg by mouth daily.       . benazepril (LOTENSIN) 10 MG tablet Take 10 mg by mouth daily.        . bethanechol (URECHOLINE) 50 MG tablet Take 1 tablet by mouth Twice daily.      . clopidogrel (PLAVIX) 75 MG tablet Take 1 tablet by mouth daily.      . EPITOL 200 MG tablet Take 100 mg by mouth Twice daily.       Marland Kitchen GLUCOSAMINE-CHONDROITIN PO Take by mouth 2 (two) times daily.      . metoprolol (LOPRESSOR) 50 MG tablet Take 25 mg by mouth 2 (two) times daily.       . Multiple Vitamins-Minerals (CENTRUM) tablet Take 1 tablet by mouth daily.        . sodium chloride (MURO 128) 2 % ophthalmic solution Place 1 drop into both eyes 3 (three) times daily.      . sodium chloride (MURO 128) 5 % ophthalmic ointment Place 1 drop into both eyes at bedtime.      .  Tamsulosin HCl (FLOMAX) 0.4 MG CAPS Take 1 tablet by mouth 2 (two) times daily.       . traMADol (ULTRAM) 50 MG tablet Take 50 mg by mouth every 8 (eight) hours as needed. Pain.      . vitamin B-12 (CYANOCOBALAMIN) 500 MCG tablet Take 500 mcg by mouth daily.         No current facility-administered medications for this visit.    Allergies  Allergen Reactions  . Carvedilol     REACTION: multiple myalgias  . Celecoxib     Review of Systems negative except from HPI and PMH  Physical Exam BP 117/72  Pulse 65  Ht 5\' 10"  (1.778 m)  Wt 175 lb 6.4 oz (79.561 kg)  BMI 25.17 kg/m2 Well developed and well nourished in no acute distress HENT normal E scleral and icterus clear Neck Supple JVP flat; carotids brisk and full Clear to ausculation  Regular rate and rhythm, no murmurs gallops or rub Soft with active bowel sounds No clubbing cyanosis no Edema Alert and oriented, a is a r l eg atrophy with weakness of r hip flexor  Increased relfex  Skin Warm and Dry    Assessment and  Plan

## 2013-03-28 NOTE — Patient Instructions (Signed)
You have been referred to Neurology for right leg weakness  Your physician recommends that you continue on your current medications as directed. Please refer to the Current Medication list given to you today.  Your physician wants you to follow-up in: one year with Dr. Graciela Husbands.  You will receive a reminder letter in the mail two months in advance. If you don't receive a letter, please call our office to schedule the follow-up appointment.

## 2013-05-08 ENCOUNTER — Encounter: Payer: Self-pay | Admitting: Neurology

## 2013-05-08 ENCOUNTER — Ambulatory Visit (INDEPENDENT_AMBULATORY_CARE_PROVIDER_SITE_OTHER): Payer: Medicare Other | Admitting: Neurology

## 2013-05-08 VITALS — BP 118/70 | HR 84 | Temp 98.1°F | Ht 70.0 in | Wt 175.0 lb

## 2013-05-08 DIAGNOSIS — M48062 Spinal stenosis, lumbar region with neurogenic claudication: Secondary | ICD-10-CM

## 2013-05-08 DIAGNOSIS — R29898 Other symptoms and signs involving the musculoskeletal system: Secondary | ICD-10-CM

## 2013-05-08 LAB — CK: Total CK: 39 U/L (ref 7–232)

## 2013-05-08 LAB — VITAMIN B12: Vitamin B-12: 1324 pg/mL — ABNORMAL HIGH (ref 211–911)

## 2013-05-08 LAB — TSH: TSH: 1.96 u[IU]/mL (ref 0.35–5.50)

## 2013-05-08 NOTE — Patient Instructions (Signed)
1.  CT lumbar spine without contrast 2.  Physical therapy for right hip flexion weakness 3.  Return to clinic in 36-months

## 2013-05-08 NOTE — Progress Notes (Signed)
[facility] Clinic Note - Initial Visit   Date: 05/08/2013    [name] MRN: [id number] DOB: 1926-07-17   Dear Dr Caryl Comes:  Thank you for your kind referral of [name] for consultation of right leg weakness. Although his history is well known to you, please allow [place] to reiterate it for the purpose of our medical record. The patient was accompanied to the clinic by wife, [name].   History of Present Illness: [name] is a 80 y.o. year-old right-handed Caucasian male with history of ischemic heart disease s/p CABG (2006), sinus node dysfunction s/p AICD implantation, hypertension, colon cancer s/p resection (2007) and BPH presenting for evaluation of right leg weakness.    Around 2009, he noticed gradually onset of right leg weakness which has progressively worsened. He has difficulty with prolonged walking and standing but has to take frequent breaks because of pain. Symptoms are better with laying and sitting. He has associated back pain and radiculopathy in the past.  He went to pain management about 2 years ago for his back pain and was told he has arthritis but would not be a surgical candidate, so was treated with pain relievers.  He did not have steroid injections.  Currently, he only has "hurting" pain with weight bearing and has been taking ibuprofen 400mg  BID which alleviates his pain. He has not done any recent physical therapy for his leg.  He has not had any falls.    Denies any numbness/tingling, myalgias, muscle spasms/cramps, or bowel/bladder incontinence.  Patient was evaluated by his cardiology, Dr. Caryl Comes, for ischemic heart disease on 03/28/2013 at which time he noticed right leg atrophy, hip flexion weakness, and brisk patella jerk and referred to neurology for further evaluation.    Past Medical History  Diagnosis Date  . Sinus node dysfunction   . Ischemic cardiomyopathy   . S/P implantation of automatic cardioverter/defibrillator  (AICD)     For the above-Medtronic EnTrust  . Pacemaker     Nov. 2014  . Hypertension   . BPH (benign prostatic hyperplasia)   . Colon cancer     Partial resection    Past Surgical History  Procedure Laterality Date  . Cardiac defibrillator placement      Medtronic EnTrust  . Coronary artery bypass graft    . Colon cancer       Medications:  Current Outpatient Prescriptions on File Prior to Visit  Medication Sig Dispense Refill  . amLODipine (NORVASC) 5 MG tablet Take 5 mg by mouth daily.       . benazepril (LOTENSIN) 10 MG tablet Take 10 mg by mouth daily.        . bethanechol (URECHOLINE) 50 MG tablet Take 1 tablet by mouth Twice daily.      . clopidogrel (PLAVIX) 75 MG tablet Take 1 tablet by mouth daily.      . EPITOL 200 MG tablet Take 100 mg by mouth Twice daily.       Marland Kitchen GLUCOSAMINE-CHONDROITIN PO Take by mouth 2 (two) times daily.      . metoprolol (LOPRESSOR) 50 MG tablet Take 25 mg by mouth 2 (two) times daily.       . Multiple Vitamins-Minerals (CENTRUM) tablet Take 1 tablet by mouth daily.        . sodium chloride (MURO 128) 2 % ophthalmic solution Place 1 drop into both eyes 3 (three) times daily.      . sodium chloride (MURO 128) 5 % ophthalmic ointment Place  1 drop into both eyes at bedtime.      . Tamsulosin HCl (FLOMAX) 0.4 MG CAPS Take 1 tablet by mouth 2 (two) times daily.       . traMADol (ULTRAM) 50 MG tablet Take 50 mg by mouth every 8 (eight) hours as needed. Pain.      . vitamin B-12 (CYANOCOBALAMIN) 500 MCG tablet Take 500 mcg by mouth daily.         No current facility-administered medications on file prior to visit.    Allergies:  Allergies  Allergen Reactions  . Carvedilol     REACTION: multiple myalgias  . Celecoxib     Family History: Family History  Problem Relation Age of Onset  . Diabetes Neg Hx   . Hypertension Neg Hx   . Coronary artery disease Neg Hx   . Other Mother     Died, MVA  . Lung cancer Brother   . Cancer Sister   .  Gout Son     Social History: History   Social History  . Marital Status: Married    Spouse Name: N/A    Number of Children: N/A  . Years of Education: N/A   Occupational History  . Retired    Social History Main Topics  . Smoking status: Former Research scientist (life sciences)  . Smokeless tobacco: Former Systems developer  . Alcohol Use: No  . Drug Use: No  . Sexual Activity: Not on file   Other Topics Concern  . Not on file   Social History Narrative   Lives with wife, [name].  They have three grown children.   Retired from farming (beef cattle, Sales promotion account executive).    Review of Systems:  CONSTITUTIONAL: No fevers, chills, night sweats, or weight loss.   EYES: No visual changes or eye pain ENT: No hearing changes.  No history of nose bleeds.   RESPIRATORY: No cough, wheezing and shortness of breath.   CARDIOVASCULAR: Negative for chest pain, and palpitations.   GI: Negative for abdominal discomfort, blood in stools or black stools.  No recent change in bowel habits.   GU:  No history of incontinence.   MUSCLOSKELETAL: No history of joint pain or swelling.  No myalgias.   SKIN: Negative for lesions, rash, and itching.   HEMATOLOGY/ONCOLOGY: Negative for prolonged bleeding, bruising easily, and swollen nodes   ENDOCRINE: Negative for cold or heat intolerance, polydipsia or goiter.   PSYCH:  No depression or anxiety symptoms.   NEURO: As Above.   Vital Signs:  BP 118/70  Pulse 84  Temp(Src) 98.1 F (36.7 C)  Ht 5\' 10"  (1.778 m)  Wt 175 lb (79.379 kg)  BMI 25.11 kg/m2   General Medical Exam:   General:  Well appearing, comfortable.   Eyes/ENT: see cranial nerve examination.   Neck: No masses appreciated.  Full range of motion without tenderness.   Respiratory:  Clear to auscultation, good air entry bilaterally.   Cardiac:  Regular rate and rhythm, no murmur.    Back:  No pain to palpation of spinous processes.   Extremities:  No deformities, edema, or skin discoloration. Good capillary refill.   Skin:   Skin color, texture, turgor normal. No rashes or lesions.  Neurological Exam: MENTAL STATUS including orientation to time, place, person, recent and remote memory, attention span and concentration, language, and fund of knowledge is normal.  Speech is not dysarthric.  CRANIAL NERVES: II:  No visual field defects.  Unremarkable fundi.   III-IV-VI: Pupils equal round and reactive to  light.  Mildly restricted upward gaze, otherwise normal conjugate, extra-ocular eye movements in all directions of gaze.  No nystagmus.  No ptosis. V:  Normal facial sensation.     VII:  Normal facial symmetry and movements.    VIII:  Normal hearing and vestibular function.   IX-X:  Normal palatal movement.   XI:  Normal shoulder shrug and head rotation.   XII:  Normal tongue strength and range of motion, no deviation or fasciculation.  MOTOR:  Moderate atrophy of the right quadriceps muscles.  No fasciculations or abnormal movements.  No pronator drift.  Tone is normal.    Right Upper Extremity:    Left Upper Extremity:    Deltoid  5/5   Deltoid  5/5   Biceps  5/5   Biceps  5/5   Triceps  5/5   Triceps  5/5   Wrist extensors  5/5   Wrist extensors  5/5   Wrist flexors  5/5   Wrist flexors  5/5   Finger extensors  5/5   Finger extensors  5/5   Finger flexors  5/5   Finger flexors  5/5   Dorsal interossei  5/5   Dorsal interossei  5/5   Abductor pollicis  5/5   Abductor pollicis  5/5   Tone (Ashworth scale)  0  Tone (Ashworth scale)  0   Right Lower Extremity:    Left Lower Extremity:    Hip flexors  4+/5   Hip flexors  5/5   Hip extensors  5/5   Hip extensors  5/5   Adductors 5/5  Adductors 5/5  Abductors 5/5  Abductors 5/5  Knee flexors  5/5   Knee flexors  5/5   Knee extensors  5-/5   Knee extensors  5/5   Dorsiflexors  5/5   Dorsiflexors  5/5   Plantarflexors  5/5   Plantarflexors  5/5   Toe extensors  5/5   Toe extensors  5/5   Toe flexors  5/5   Toe flexors  5/5   Tone (Ashworth scale)  0  Tone  (Ashworth scale)  0    MSRs:  Right                                                                 Left brachioradialis 2+  brachioradialis 2+  biceps 2+  biceps 2+  triceps 2+  triceps 2+  patellar 1+  patellar 2+  ankle jerk 0  ankle jerk 0  Hoffman no  Hoffman no  plantar response mute  plantar response down   SENSORY:  Pin prick reduced over the proximal right thigh, otherwise normal and symmetric perception of light touch and proprioceptio.  Vibration reduced distal to ankles bilaterally.  COORDINATION/GAIT: Normal finger-to- nose-finger and heel-to-shin.  Intact rapid alternating movements bilaterally.  Able to rise from a chair without using arms.  Slightly stooped posture with moderately wide-based waddling type gait.  Unable to perform tandem and stressed gait intact.     IMPRESSION/PLAN: Mr. Hubbard is a 80 year-old gentleman presenting for evaluation of right leg weakness weakness.  His neurological examination shows weakness of right hip flexion > knee extension, with preserved adductors.  There is also reduced patella ankle jerk on the right, compared to the  left.  The distribution of his weakness can represent right femoral neuropathy or spinal stenosis affecting L2-L4 nerve roots.  An EMG would help localize his symptoms, however he is reluctant to have this.  Clinical symptoms do not suggest a myopathy. I will obtain CT lumbar spine to look for structural pathology and have recommended physical therapy.  I will see him again in 21-months and if there is no improvement with therapy, will recommend EMG, if patient is agreeable.    The duration of this appointment visit was 45 minutes of face-to-face time with the patient.  Greater than 50% of this time was spent in counseling, explanation of diagnosis, planning of further management, and coordination of care.   Thank you for allowing me to participate in patient's care.  If I can answer any additional questions, I would be  pleased to do so.    Sincerely,    Donika K. Posey Pronto, DO

## 2013-05-10 DIAGNOSIS — M6281 Muscle weakness (generalized): Secondary | ICD-10-CM | POA: Diagnosis not present

## 2013-05-12 DIAGNOSIS — M6281 Muscle weakness (generalized): Secondary | ICD-10-CM | POA: Diagnosis not present

## 2013-05-15 DIAGNOSIS — R29898 Other symptoms and signs involving the musculoskeletal system: Secondary | ICD-10-CM | POA: Diagnosis not present

## 2013-05-15 DIAGNOSIS — M48062 Spinal stenosis, lumbar region with neurogenic claudication: Secondary | ICD-10-CM | POA: Diagnosis not present

## 2013-05-15 DIAGNOSIS — M5126 Other intervertebral disc displacement, lumbar region: Secondary | ICD-10-CM | POA: Diagnosis not present

## 2013-05-15 DIAGNOSIS — M48061 Spinal stenosis, lumbar region without neurogenic claudication: Secondary | ICD-10-CM | POA: Diagnosis not present

## 2013-05-15 DIAGNOSIS — M1610 Unilateral primary osteoarthritis, unspecified hip: Secondary | ICD-10-CM | POA: Diagnosis not present

## 2013-05-16 ENCOUNTER — Telehealth: Payer: Self-pay | Admitting: Neurology

## 2013-05-16 NOTE — Telephone Encounter (Signed)
I attempted to contact patient via phone today regarding the results of CT lumbar spine, however there was no answer so a message was left for the patient to return my call.  Andrew Michelin K. Posey Pronto, DO

## 2013-05-17 DIAGNOSIS — M6281 Muscle weakness (generalized): Secondary | ICD-10-CM | POA: Diagnosis not present

## 2013-05-17 NOTE — Telephone Encounter (Signed)
Rec'd call from patient.  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  There is a small broad-based disc protrusion at L4-5 into the right neural foramen without impingement of the L4 nerve root.  I discussed that his symptoms may be related to the severe arthritis of his right hip which is causing his leg symptoms and would like to refer him to orthopeadics, but he is not interested and would prefer to have physical therapy.  He is taking care of his wife with cancer and said that he may reconsider at a later time, but either way, he is not interested in surgery.  I will reassess how he is doing in 29-months.  Keylin Ferryman K. Posey Pronto, DO

## 2013-05-19 DIAGNOSIS — M6281 Muscle weakness (generalized): Secondary | ICD-10-CM | POA: Diagnosis not present

## 2013-05-22 DIAGNOSIS — M6281 Muscle weakness (generalized): Secondary | ICD-10-CM | POA: Diagnosis not present

## 2013-05-24 DIAGNOSIS — M6281 Muscle weakness (generalized): Secondary | ICD-10-CM | POA: Diagnosis not present

## 2013-05-26 DIAGNOSIS — M6281 Muscle weakness (generalized): Secondary | ICD-10-CM | POA: Diagnosis not present

## 2013-05-26 DIAGNOSIS — B000 Eczema herpeticum: Secondary | ICD-10-CM | POA: Diagnosis not present

## 2013-05-29 DIAGNOSIS — M6281 Muscle weakness (generalized): Secondary | ICD-10-CM | POA: Diagnosis not present

## 2013-05-31 DIAGNOSIS — M6281 Muscle weakness (generalized): Secondary | ICD-10-CM | POA: Diagnosis not present

## 2013-06-02 DIAGNOSIS — M6281 Muscle weakness (generalized): Secondary | ICD-10-CM | POA: Diagnosis not present

## 2013-06-05 DIAGNOSIS — M6281 Muscle weakness (generalized): Secondary | ICD-10-CM | POA: Diagnosis not present

## 2013-06-06 DIAGNOSIS — R21 Rash and other nonspecific skin eruption: Secondary | ICD-10-CM | POA: Diagnosis not present

## 2013-06-07 DIAGNOSIS — M6281 Muscle weakness (generalized): Secondary | ICD-10-CM | POA: Diagnosis not present

## 2013-06-09 ENCOUNTER — Encounter: Payer: Self-pay | Admitting: Neurology

## 2013-06-09 DIAGNOSIS — M6281 Muscle weakness (generalized): Secondary | ICD-10-CM | POA: Diagnosis not present

## 2013-06-12 DIAGNOSIS — M6281 Muscle weakness (generalized): Secondary | ICD-10-CM | POA: Diagnosis not present

## 2013-06-14 DIAGNOSIS — M6281 Muscle weakness (generalized): Secondary | ICD-10-CM | POA: Diagnosis not present

## 2013-06-16 DIAGNOSIS — M6281 Muscle weakness (generalized): Secondary | ICD-10-CM | POA: Diagnosis not present

## 2013-06-23 DIAGNOSIS — M6281 Muscle weakness (generalized): Secondary | ICD-10-CM | POA: Diagnosis not present

## 2013-06-28 DIAGNOSIS — M6281 Muscle weakness (generalized): Secondary | ICD-10-CM | POA: Diagnosis not present

## 2013-06-29 ENCOUNTER — Ambulatory Visit (INDEPENDENT_AMBULATORY_CARE_PROVIDER_SITE_OTHER): Payer: Medicare Other | Admitting: *Deleted

## 2013-06-29 DIAGNOSIS — I495 Sick sinus syndrome: Secondary | ICD-10-CM

## 2013-06-29 LAB — MDC_IDC_ENUM_SESS_TYPE_REMOTE
Battery Impedance: 111 Ohm
Brady Statistic AP VP Percent: 3 %
Brady Statistic AP VS Percent: 95 %
Brady Statistic AS VP Percent: 0 %
Brady Statistic AS VS Percent: 2 %
Date Time Interrogation Session: 20150226200347
Lead Channel Impedance Value: 460 Ohm
Lead Channel Impedance Value: 527 Ohm
Lead Channel Pacing Threshold Amplitude: 0.375 V
Lead Channel Pacing Threshold Pulse Width: 0.4 ms
Lead Channel Setting Pacing Amplitude: 2.5 V
MDC IDC MSMT BATTERY REMAINING LONGEVITY: 149 mo
MDC IDC MSMT BATTERY VOLTAGE: 2.8 V
MDC IDC MSMT LEADCHNL RV PACING THRESHOLD AMPLITUDE: 0.75 V
MDC IDC MSMT LEADCHNL RV PACING THRESHOLD PULSEWIDTH: 0.4 ms
MDC IDC MSMT LEADCHNL RV SENSING INTR AMPL: 8 mV
MDC IDC SET LEADCHNL RA PACING AMPLITUDE: 2 V
MDC IDC SET LEADCHNL RV PACING PULSEWIDTH: 0.4 ms
MDC IDC SET LEADCHNL RV SENSING SENSITIVITY: 2.8 mV

## 2013-06-30 DIAGNOSIS — M6281 Muscle weakness (generalized): Secondary | ICD-10-CM | POA: Diagnosis not present

## 2013-07-05 DIAGNOSIS — M5137 Other intervertebral disc degeneration, lumbosacral region: Secondary | ICD-10-CM | POA: Diagnosis not present

## 2013-07-05 DIAGNOSIS — M999 Biomechanical lesion, unspecified: Secondary | ICD-10-CM | POA: Diagnosis not present

## 2013-07-10 ENCOUNTER — Ambulatory Visit (INDEPENDENT_AMBULATORY_CARE_PROVIDER_SITE_OTHER): Payer: Medicare Other | Admitting: Neurology

## 2013-07-10 ENCOUNTER — Encounter: Payer: Self-pay | Admitting: Neurology

## 2013-07-10 VITALS — BP 140/88 | HR 87 | Temp 98.1°F | Wt 176.6 lb

## 2013-07-10 DIAGNOSIS — M5417 Radiculopathy, lumbosacral region: Secondary | ICD-10-CM

## 2013-07-10 DIAGNOSIS — IMO0002 Reserved for concepts with insufficient information to code with codable children: Secondary | ICD-10-CM

## 2013-07-10 DIAGNOSIS — M1610 Unilateral primary osteoarthritis, unspecified hip: Secondary | ICD-10-CM

## 2013-07-10 DIAGNOSIS — M1611 Unilateral primary osteoarthritis, right hip: Secondary | ICD-10-CM

## 2013-07-10 NOTE — Progress Notes (Signed)
Note faxed.

## 2013-07-10 NOTE — Progress Notes (Signed)
Follow-up Visit   Date: 07/10/2013    [name] MRN: [id number] DOB: [birth year]   Interim History: [name] is a 80 y.o. right-handed Caucasian male with history of ischemic heart disease s/p CABG (2006), sinus node dysfunction s/p AICD implantation, hypertension, colon cancer s/p resection (2007) and BPH returning to the clinic for follow-up of right leg weakness.  The patient was accompanied to the clinic by his daughter who also provides collateral information.    History of present illness: Around 2009, he noticed gradually onset of right leg weakness which has progressively worsened. He has difficulty with prolonged walking and standing but has to take frequent breaks because of pain. Symptoms are better with laying and sitting. He has associated back pain and radiculopathy in the past and went to pain management ~2013. He was told he has arthritis but would not be a surgical candidate, so was treated with pain relievers. He did not have steroid injections.  He only has "hurting" pain with weight bearing and has been taking ibuprofen 400mg  BID which alleviates his pain. He has not done any recent physical therapy for his leg.  Patient was evaluated by his cardiology, Dr. Caryl Comes, for ischemic heart disease on 03/28/2013 at which time he noticed right leg atrophy, hip flexion weakness, and brisk patella jerk and referred to neurology for further evaluation.   - Follow-up 07/10/2013: At his last visit, CT lumbar spine was ordered and showed rapidly progressive severe arthritis of the right hip with erosion and destruction of the right femoral head in additional L5-S1 foraminal stenosis.  Due to his cardiac disease, he is not a surgical candidate so conservative therapy with PT for leg weakness was started.  He completed physical therapy for leg strengthening and reports having marked improvement in weakness, but gait remains problematic.  Fortunately, he has not had any falls.  They also  recommended a cane which he has been using and feels much more stable.  He denies any pain.   Medications:  Current Outpatient Prescriptions on File Prior to Visit  Medication Sig Dispense Refill  . amLODipine (NORVASC) 5 MG tablet Take 5 mg by mouth daily.       . benazepril (LOTENSIN) 10 MG tablet Take 10 mg by mouth daily.        . bethanechol (URECHOLINE) 50 MG tablet Take 1 tablet by mouth Twice daily.      . clopidogrel (PLAVIX) 75 MG tablet Take 1 tablet by mouth daily.      . EPITOL 200 MG tablet Take 100 mg by mouth Twice daily.       . finasteride (PROSCAR) 5 MG tablet       . GLUCOSAMINE-CHONDROITIN PO Take by mouth 2 (two) times daily.      . metoprolol (LOPRESSOR) 50 MG tablet Take 25 mg by mouth 2 (two) times daily.       . Multiple Vitamins-Minerals (CENTRUM) tablet Take 1 tablet by mouth daily.        . sodium chloride (MURO 128) 2 % ophthalmic solution Place 1 drop into both eyes 3 (three) times daily.      . sodium chloride (MURO 128) 5 % ophthalmic ointment Place 1 drop into both eyes at bedtime.      . Tamsulosin HCl (FLOMAX) 0.4 MG CAPS Take 1 tablet by mouth 2 (two) times daily.       . traMADol (ULTRAM) 50 MG tablet Take 50 mg by mouth every 8 (eight) hours as  needed. Pain.      . vitamin B-12 (CYANOCOBALAMIN) 500 MCG tablet Take 500 mcg by mouth daily.         No current facility-administered medications on file prior to visit.    Allergies:  Allergies  Allergen Reactions  . Carvedilol     REACTION: multiple myalgias  . Celecoxib     Review of Systems:  CONSTITUTIONAL: No fevers, chills, night sweats, or weight loss.   EYES: No visual changes or eye pain ENT: No hearing changes.  No history of nose bleeds.   RESPIRATORY: No cough, wheezing and shortness of breath.   CARDIOVASCULAR: Negative for chest pain, and palpitations.   GI: Negative for abdominal discomfort, blood in stools or black stools.  No recent change in bowel habits.   GU:  No history of  incontinence.   MUSCLOSKELETAL: No history of joint pain or swelling.  No myalgias.   SKIN: Negative for lesions, rash, and itching.   ENDOCRINE: Negative for cold or heat intolerance, polydipsia or goiter.   PSYCH:  No depression or anxiety symptoms.   NEURO: As Above.   Vital Signs:  BP 140/88  Pulse 87  Temp(Src) 98.1 F (36.7 C)  Wt 176 lb 9 oz (80.088 kg)  SpO2 96%  Neurological Exam: MENTAL STATUS including orientation to time, place, person, recent and remote memory, attention span and concentration, language, and fund of knowledge is normal.  Speech is not dysarthric.  CRANIAL NERVES: No visual field defects.  Pupils equal round and reactive to light.  Normal conjugate, extra-ocular eye movements in all directions of gaze.  No ptosis. Normal facial sensation.  Face is symmetric. Palate elevates symmetrically.  Tongue is midline.  MOTOR:  Motor strength is 5/5 in all extremities, including right hip flexion and knee extension (improved from last visit).  Tone is normal.    MSRs:  Right      Left  brachioradialis  2+   brachioradialis  2+   biceps  2+   biceps  2+   triceps  2+   triceps  2+   patellar  1+   patellar  2+   ankle jerk  0   ankle jerk  0    SENSORY:  Vibration is reduced distal to ankles bilaterally.  COORDINATION/GAIT:  Normal finger-to- nose-finger and heel-to-shin.  Intact rapid alternating movements bilaterally. Slightly stooped posture with moderately wide-based antalgic gait with slight.   Data: CT lumbar spine results (05/15/2013) 1.  Patient has rapidly progressive severe arthritis of the right hip.  The right femoral head ismarkedly eroded when compared to the right CT scan of 05/17/2010. 2. Small broad-based disc protrusion at L4-5 extending into the lateral to the rightright neural foramen but without focal impingement upon the exiting L4 nerve root. 3. Auto-fusion of the L5-S1 level with severe chronic right foraminal  stenosis   IMPRESSION/PLAN: 1.  Right hip weakness - improved  - Etiology:  severe arthritis of the right hip (erosion and destruction of the R femoral head)  - He has completed PT with significant improvement in quad weakness  - I discussed referral to orthopeadic surgery for non-surgical options, but since he is doing well, he would like to hold off for now  - If weakness returns, may consider physical therapy again.  - Fall precautions discussed and literature provided 2.  Small broad-based disc protrusion at L4-5 and L5-S1 foraminal stenosis  - Currently, he is asymptomatic 3.  Return to clinic as needed  The duration of this appointment visit was 25 minutes of face-to-face time with the patient.  Greater than 50% of this time was spent in counseling, explanation of diagnosis, planning of further management, and coordination of care.   Thank you for allowing me to participate in patient's care.  If I can answer any additional questions, I would be pleased to do so.    Sincerely,    Donika K. Posey Pronto, DO

## 2013-07-10 NOTE — Patient Instructions (Signed)
 Neurology  Preventing Falls in the Home   Falls are common, often dreaded events in the lives of older people. Aside from the obvious injuries and even death that may result, falls can cause wide-ranging consequences including loss of independence, mental decline, decreased activity, and mobility. Younger people are also at risk of falling, especially those with chronic illnesses and fatigue.  Ways to reduce the risk for falling:  * Examine diet and medications. Warm foods and alcohol dilate blood vessels, which can lead to dizziness when standing. Sleep aids, antidepressants, and pain medications can also increase the likelihood of a fall.  * Get a vison exam. Poor vision, cataracts, and glaucoma increase the chances of falling.  * Check foot gear. Shoes should fit snugly and have a sturdy, nonskid sole and broad, low heel.  * Participate in a physician-approved exercise program to build and maintain muscle strength and improve balance and coordination.  * Increase vitamin D intake. Vitamin D improves muscle strength and increases the amount of calcium the body is able to absorb and deposit in bones.  How to prevent falls from common hazards:  * Floors - Remove all loose wires, cords, and throw rugs. Minimize clutter. Make sure rugs are anchored and smooth. Keep furniture in its usual place.  * Chairs - Use chairs with straight backs, armrests, and firm seats. Add firm cushions to existing pieces to add height.  * Bathroom - Install grab bars and non-skid tape in the tub or shower. Use a bathtub transfer bench or a shower chair with a back support. Use an elevated toilet seat and/or safety rails to assist standing from a low surface. Do not use towel racks or bathroom tissue holders to help you stand.  * Lighting - Make sure halls, stairways, and entrances are well-lit. Install a night light in your bathroom or hallway. Make sure there is a light switch at the top and bottom of the  staircase. Turn lights on if you get up in the middle of the night. Make sure lamps or light switches are within reach of the bed if you have to get up during the night.  * Kitchen - Install non-skid rubber mats near the sink and stove. Clean spills immediately. Store frequently used utensils, pots, and pans between waist and eye level. This helps prevent reaching and bending. Sit when getting things out of the lower cupboards.  * Living room / Bedrooms - Place furniture with wide spaces in between, giving enough room to move around. Establish a route through the living room that gives you something to hold onto as you walk.  * Stairs - Make sure treads, rails, and rugs are secure. Install a rail on both sides of the stairs. If stairs are a threat, it might be helpful to arrange most of your activities on the lower level to reduce the number of times you must climb the stairs.  * Entrances and doorways - Install metal handles on the walls adjacent to the doorknobs of all doors to make it more secure as you travel through the doorway.  Tips for maintaining balance:  * Keep at least one hand free at all times Try using a backpack or fanny pack to hold things rather than carrying them in your hands. Never carry objects in both hands when walking as this interferes with keeping your balance.  * Attempt to swing both arms from front to back while walking. This might require a conscious effort if Parkinson's   disease has diminished your movement. It will, however, help you to maintain balance and posture, and reduce fatigue.  * Consciously lift your feet off the ground when walking. Shuffling and dragging of the feet is a common culprit in losing your balance.  * When trying to navigate turns, use a "U" technique of facing forward and making a wide turn, rather than pivoting sharply.  * Try to stand with your feet shoulder-length apart. When your feet are close together for any length of time, you increase your risk of  losing your balance and falling.  * Do one thing at a time. Do not try to walk and accomplish another task, such as reading or looking around. The decrease in your automatic reflexes complicates motor function, so the less distraction, the better.  * Do not wear rubber or gripping soled shoes, they might "catch" on the floor and cause tripping.  * Move slowly when changing positions. Use deliberate, concentrated movements and, if needed, use a grab bar or walking aid. Count fifteen (15) seconds after standing to begin walking.  * If balance is a continuous problem, you might want to consider a walking aid such as a cane, walking stick, or walker. Once you have mastered walking with help, you may be ready to try it again on your own.  This information is provided by [facility] and is not intended to replace the medical advice of your physician or other health care providers. Please consult your physician or other health care providers for advice regarding your specific medical condition.

## 2013-07-13 DIAGNOSIS — M999 Biomechanical lesion, unspecified: Secondary | ICD-10-CM | POA: Diagnosis not present

## 2013-07-13 DIAGNOSIS — M5137 Other intervertebral disc degeneration, lumbosacral region: Secondary | ICD-10-CM | POA: Diagnosis not present

## 2013-07-18 DIAGNOSIS — C189 Malignant neoplasm of colon, unspecified: Secondary | ICD-10-CM | POA: Diagnosis not present

## 2013-07-18 DIAGNOSIS — I10 Essential (primary) hypertension: Secondary | ICD-10-CM | POA: Diagnosis not present

## 2013-07-18 DIAGNOSIS — E785 Hyperlipidemia, unspecified: Secondary | ICD-10-CM | POA: Diagnosis not present

## 2013-07-18 DIAGNOSIS — E119 Type 2 diabetes mellitus without complications: Secondary | ICD-10-CM | POA: Diagnosis not present

## 2013-07-24 ENCOUNTER — Encounter: Payer: Self-pay | Admitting: *Deleted

## 2013-07-26 DIAGNOSIS — I428 Other cardiomyopathies: Secondary | ICD-10-CM | POA: Diagnosis not present

## 2013-07-26 DIAGNOSIS — E119 Type 2 diabetes mellitus without complications: Secondary | ICD-10-CM | POA: Diagnosis not present

## 2013-07-26 DIAGNOSIS — I10 Essential (primary) hypertension: Secondary | ICD-10-CM | POA: Diagnosis not present

## 2013-08-01 ENCOUNTER — Encounter: Payer: Self-pay | Admitting: Internal Medicine

## 2013-08-24 DIAGNOSIS — M5137 Other intervertebral disc degeneration, lumbosacral region: Secondary | ICD-10-CM | POA: Diagnosis not present

## 2013-08-24 DIAGNOSIS — M999 Biomechanical lesion, unspecified: Secondary | ICD-10-CM | POA: Diagnosis not present

## 2013-08-29 DIAGNOSIS — I2510 Atherosclerotic heart disease of native coronary artery without angina pectoris: Secondary | ICD-10-CM | POA: Diagnosis not present

## 2013-08-29 DIAGNOSIS — I428 Other cardiomyopathies: Secondary | ICD-10-CM | POA: Diagnosis not present

## 2013-09-07 DIAGNOSIS — Z634 Disappearance and death of family member: Secondary | ICD-10-CM | POA: Diagnosis not present

## 2013-09-07 DIAGNOSIS — R21 Rash and other nonspecific skin eruption: Secondary | ICD-10-CM | POA: Diagnosis not present

## 2013-09-20 DIAGNOSIS — R972 Elevated prostate specific antigen [PSA]: Secondary | ICD-10-CM | POA: Diagnosis not present

## 2013-09-20 DIAGNOSIS — M999 Biomechanical lesion, unspecified: Secondary | ICD-10-CM | POA: Diagnosis not present

## 2013-09-20 DIAGNOSIS — N401 Enlarged prostate with lower urinary tract symptoms: Secondary | ICD-10-CM | POA: Diagnosis not present

## 2013-09-20 DIAGNOSIS — N138 Other obstructive and reflux uropathy: Secondary | ICD-10-CM | POA: Diagnosis not present

## 2013-09-20 DIAGNOSIS — M5137 Other intervertebral disc degeneration, lumbosacral region: Secondary | ICD-10-CM | POA: Diagnosis not present

## 2013-10-02 ENCOUNTER — Ambulatory Visit (INDEPENDENT_AMBULATORY_CARE_PROVIDER_SITE_OTHER): Payer: Medicare Other | Admitting: *Deleted

## 2013-10-02 ENCOUNTER — Encounter: Payer: Self-pay | Admitting: Internal Medicine

## 2013-10-02 ENCOUNTER — Telehealth: Payer: Self-pay | Admitting: Cardiology

## 2013-10-02 DIAGNOSIS — I495 Sick sinus syndrome: Secondary | ICD-10-CM | POA: Diagnosis not present

## 2013-10-02 LAB — MDC_IDC_ENUM_SESS_TYPE_REMOTE
Battery Remaining Longevity: 147 mo
Brady Statistic AP VP Percent: 3 %
Lead Channel Impedance Value: 440 Ohm
Lead Channel Sensing Intrinsic Amplitude: 5.6 mV
Lead Channel Setting Pacing Amplitude: 2 V
Lead Channel Setting Pacing Pulse Width: 0.4 ms
Lead Channel Setting Sensing Sensitivity: 2.8 mV
MDC IDC MSMT BATTERY IMPEDANCE: 111 Ohm
MDC IDC MSMT BATTERY VOLTAGE: 2.8 V
MDC IDC MSMT LEADCHNL RA PACING THRESHOLD AMPLITUDE: 0.375 V
MDC IDC MSMT LEADCHNL RA PACING THRESHOLD PULSEWIDTH: 0.4 ms
MDC IDC MSMT LEADCHNL RV IMPEDANCE VALUE: 441 Ohm
MDC IDC MSMT LEADCHNL RV PACING THRESHOLD AMPLITUDE: 0.75 V
MDC IDC MSMT LEADCHNL RV PACING THRESHOLD PULSEWIDTH: 0.4 ms
MDC IDC SESS DTM: 20150601164010
MDC IDC SET LEADCHNL RV PACING AMPLITUDE: 2.5 V
MDC IDC STAT BRADY AP VS PERCENT: 95 %
MDC IDC STAT BRADY AS VP PERCENT: 0 %
MDC IDC STAT BRADY AS VS PERCENT: 1 %

## 2013-10-02 NOTE — Progress Notes (Signed)
Remote pacemaker transmission.   

## 2013-10-02 NOTE — Telephone Encounter (Signed)
Spoke with pt and reminded pt of remote transmission that is due today. Pt verbalized understanding.   

## 2013-10-19 DIAGNOSIS — E785 Hyperlipidemia, unspecified: Secondary | ICD-10-CM | POA: Diagnosis not present

## 2013-10-19 DIAGNOSIS — G519 Disorder of facial nerve, unspecified: Secondary | ICD-10-CM | POA: Diagnosis not present

## 2013-10-19 DIAGNOSIS — E119 Type 2 diabetes mellitus without complications: Secondary | ICD-10-CM | POA: Diagnosis not present

## 2013-10-19 DIAGNOSIS — I428 Other cardiomyopathies: Secondary | ICD-10-CM | POA: Diagnosis not present

## 2013-10-19 DIAGNOSIS — M25559 Pain in unspecified hip: Secondary | ICD-10-CM | POA: Diagnosis not present

## 2013-10-19 DIAGNOSIS — N401 Enlarged prostate with lower urinary tract symptoms: Secondary | ICD-10-CM | POA: Diagnosis not present

## 2013-10-19 DIAGNOSIS — I10 Essential (primary) hypertension: Secondary | ICD-10-CM | POA: Diagnosis not present

## 2013-10-19 DIAGNOSIS — C189 Malignant neoplasm of colon, unspecified: Secondary | ICD-10-CM | POA: Diagnosis not present

## 2013-10-31 ENCOUNTER — Encounter: Payer: Self-pay | Admitting: Cardiology

## 2013-12-14 DIAGNOSIS — M545 Low back pain, unspecified: Secondary | ICD-10-CM | POA: Diagnosis not present

## 2013-12-21 DIAGNOSIS — M25559 Pain in unspecified hip: Secondary | ICD-10-CM | POA: Diagnosis not present

## 2013-12-21 DIAGNOSIS — M6281 Muscle weakness (generalized): Secondary | ICD-10-CM | POA: Diagnosis not present

## 2013-12-21 DIAGNOSIS — M545 Low back pain, unspecified: Secondary | ICD-10-CM | POA: Diagnosis not present

## 2013-12-26 DIAGNOSIS — M545 Low back pain, unspecified: Secondary | ICD-10-CM | POA: Diagnosis not present

## 2013-12-26 DIAGNOSIS — M6281 Muscle weakness (generalized): Secondary | ICD-10-CM | POA: Diagnosis not present

## 2013-12-26 DIAGNOSIS — M25559 Pain in unspecified hip: Secondary | ICD-10-CM | POA: Diagnosis not present

## 2013-12-28 DIAGNOSIS — M25559 Pain in unspecified hip: Secondary | ICD-10-CM | POA: Diagnosis not present

## 2013-12-28 DIAGNOSIS — M545 Low back pain, unspecified: Secondary | ICD-10-CM | POA: Diagnosis not present

## 2013-12-28 DIAGNOSIS — M6281 Muscle weakness (generalized): Secondary | ICD-10-CM | POA: Diagnosis not present

## 2014-01-01 DIAGNOSIS — M6281 Muscle weakness (generalized): Secondary | ICD-10-CM | POA: Diagnosis not present

## 2014-01-01 DIAGNOSIS — M545 Low back pain, unspecified: Secondary | ICD-10-CM | POA: Diagnosis not present

## 2014-01-01 DIAGNOSIS — M25559 Pain in unspecified hip: Secondary | ICD-10-CM | POA: Diagnosis not present

## 2014-01-03 ENCOUNTER — Telehealth: Payer: Self-pay | Admitting: Cardiology

## 2014-01-03 ENCOUNTER — Ambulatory Visit (INDEPENDENT_AMBULATORY_CARE_PROVIDER_SITE_OTHER): Payer: Medicare Other | Admitting: *Deleted

## 2014-01-03 ENCOUNTER — Encounter: Payer: Self-pay | Admitting: Internal Medicine

## 2014-01-03 DIAGNOSIS — I495 Sick sinus syndrome: Secondary | ICD-10-CM | POA: Diagnosis not present

## 2014-01-03 LAB — MDC_IDC_ENUM_SESS_TYPE_REMOTE
Brady Statistic AS VS Percent: 1 %
Lead Channel Impedance Value: 475 Ohm
Lead Channel Pacing Threshold Pulse Width: 0.4 ms
Lead Channel Sensing Intrinsic Amplitude: 5.6 mV
Lead Channel Setting Pacing Amplitude: 2 V
MDC IDC MSMT BATTERY IMPEDANCE: 135 Ohm
MDC IDC MSMT BATTERY REMAINING LONGEVITY: 140 mo
MDC IDC MSMT BATTERY VOLTAGE: 2.8 V
MDC IDC MSMT LEADCHNL RA IMPEDANCE VALUE: 446 Ohm
MDC IDC MSMT LEADCHNL RV PACING THRESHOLD AMPLITUDE: 0.875 V
MDC IDC SESS DTM: 20150902164143
MDC IDC SET LEADCHNL RV PACING AMPLITUDE: 2.5 V
MDC IDC SET LEADCHNL RV PACING PULSEWIDTH: 0.4 ms
MDC IDC SET LEADCHNL RV SENSING SENSITIVITY: 4 mV
MDC IDC STAT BRADY AP VP PERCENT: 3 %
MDC IDC STAT BRADY AP VS PERCENT: 96 %
MDC IDC STAT BRADY AS VP PERCENT: 0 %

## 2014-01-03 NOTE — Progress Notes (Signed)
Remote pacemaker transmission.   

## 2014-01-03 NOTE — Telephone Encounter (Signed)
LMOVM reminding pt to send remote transmission.   

## 2014-01-04 DIAGNOSIS — M545 Low back pain, unspecified: Secondary | ICD-10-CM | POA: Diagnosis not present

## 2014-01-04 DIAGNOSIS — M25559 Pain in unspecified hip: Secondary | ICD-10-CM | POA: Diagnosis not present

## 2014-01-04 DIAGNOSIS — M6281 Muscle weakness (generalized): Secondary | ICD-10-CM | POA: Diagnosis not present

## 2014-01-09 DIAGNOSIS — M545 Low back pain, unspecified: Secondary | ICD-10-CM | POA: Diagnosis not present

## 2014-01-09 DIAGNOSIS — M6281 Muscle weakness (generalized): Secondary | ICD-10-CM | POA: Diagnosis not present

## 2014-01-09 DIAGNOSIS — M25559 Pain in unspecified hip: Secondary | ICD-10-CM | POA: Diagnosis not present

## 2014-01-11 ENCOUNTER — Encounter: Payer: Self-pay | Admitting: Cardiology

## 2014-01-11 DIAGNOSIS — M545 Low back pain, unspecified: Secondary | ICD-10-CM | POA: Diagnosis not present

## 2014-01-11 DIAGNOSIS — M6281 Muscle weakness (generalized): Secondary | ICD-10-CM | POA: Diagnosis not present

## 2014-01-11 DIAGNOSIS — M25559 Pain in unspecified hip: Secondary | ICD-10-CM | POA: Diagnosis not present

## 2014-01-15 DIAGNOSIS — I428 Other cardiomyopathies: Secondary | ICD-10-CM | POA: Diagnosis not present

## 2014-01-15 DIAGNOSIS — E119 Type 2 diabetes mellitus without complications: Secondary | ICD-10-CM | POA: Diagnosis not present

## 2014-01-15 DIAGNOSIS — I2510 Atherosclerotic heart disease of native coronary artery without angina pectoris: Secondary | ICD-10-CM | POA: Diagnosis not present

## 2014-01-15 DIAGNOSIS — I10 Essential (primary) hypertension: Secondary | ICD-10-CM | POA: Diagnosis not present

## 2014-01-15 DIAGNOSIS — Z8679 Personal history of other diseases of the circulatory system: Secondary | ICD-10-CM | POA: Diagnosis not present

## 2014-01-18 DIAGNOSIS — M6281 Muscle weakness (generalized): Secondary | ICD-10-CM | POA: Diagnosis not present

## 2014-01-18 DIAGNOSIS — M25559 Pain in unspecified hip: Secondary | ICD-10-CM | POA: Diagnosis not present

## 2014-01-18 DIAGNOSIS — M545 Low back pain, unspecified: Secondary | ICD-10-CM | POA: Diagnosis not present

## 2014-01-19 DIAGNOSIS — I2510 Atherosclerotic heart disease of native coronary artery without angina pectoris: Secondary | ICD-10-CM | POA: Diagnosis not present

## 2014-01-19 DIAGNOSIS — G500 Trigeminal neuralgia: Secondary | ICD-10-CM | POA: Diagnosis not present

## 2014-01-19 DIAGNOSIS — I10 Essential (primary) hypertension: Secondary | ICD-10-CM | POA: Diagnosis not present

## 2014-01-19 DIAGNOSIS — E119 Type 2 diabetes mellitus without complications: Secondary | ICD-10-CM | POA: Diagnosis not present

## 2014-01-19 DIAGNOSIS — E785 Hyperlipidemia, unspecified: Secondary | ICD-10-CM | POA: Diagnosis not present

## 2014-01-22 DIAGNOSIS — M545 Low back pain, unspecified: Secondary | ICD-10-CM | POA: Diagnosis not present

## 2014-01-22 DIAGNOSIS — M25559 Pain in unspecified hip: Secondary | ICD-10-CM | POA: Diagnosis not present

## 2014-01-22 DIAGNOSIS — M6281 Muscle weakness (generalized): Secondary | ICD-10-CM | POA: Diagnosis not present

## 2014-01-25 DIAGNOSIS — M25559 Pain in unspecified hip: Secondary | ICD-10-CM | POA: Diagnosis not present

## 2014-01-25 DIAGNOSIS — M6281 Muscle weakness (generalized): Secondary | ICD-10-CM | POA: Diagnosis not present

## 2014-01-25 DIAGNOSIS — M545 Low back pain, unspecified: Secondary | ICD-10-CM | POA: Diagnosis not present

## 2014-01-29 DIAGNOSIS — M545 Low back pain, unspecified: Secondary | ICD-10-CM | POA: Diagnosis not present

## 2014-01-29 DIAGNOSIS — M6281 Muscle weakness (generalized): Secondary | ICD-10-CM | POA: Diagnosis not present

## 2014-01-29 DIAGNOSIS — M25559 Pain in unspecified hip: Secondary | ICD-10-CM | POA: Diagnosis not present

## 2014-02-01 DIAGNOSIS — M25551 Pain in right hip: Secondary | ICD-10-CM | POA: Diagnosis not present

## 2014-02-01 DIAGNOSIS — M6281 Muscle weakness (generalized): Secondary | ICD-10-CM | POA: Diagnosis not present

## 2014-02-01 DIAGNOSIS — M545 Low back pain: Secondary | ICD-10-CM | POA: Diagnosis not present

## 2014-02-05 DIAGNOSIS — M25551 Pain in right hip: Secondary | ICD-10-CM | POA: Diagnosis not present

## 2014-02-05 DIAGNOSIS — M545 Low back pain: Secondary | ICD-10-CM | POA: Diagnosis not present

## 2014-02-05 DIAGNOSIS — M6281 Muscle weakness (generalized): Secondary | ICD-10-CM | POA: Diagnosis not present

## 2014-02-08 DIAGNOSIS — M25551 Pain in right hip: Secondary | ICD-10-CM | POA: Diagnosis not present

## 2014-02-08 DIAGNOSIS — M6281 Muscle weakness (generalized): Secondary | ICD-10-CM | POA: Diagnosis not present

## 2014-02-08 DIAGNOSIS — M545 Low back pain: Secondary | ICD-10-CM | POA: Diagnosis not present

## 2014-02-12 DIAGNOSIS — M6281 Muscle weakness (generalized): Secondary | ICD-10-CM | POA: Diagnosis not present

## 2014-02-12 DIAGNOSIS — M545 Low back pain: Secondary | ICD-10-CM | POA: Diagnosis not present

## 2014-02-12 DIAGNOSIS — M25551 Pain in right hip: Secondary | ICD-10-CM | POA: Diagnosis not present

## 2014-02-13 DIAGNOSIS — M5136 Other intervertebral disc degeneration, lumbar region: Secondary | ICD-10-CM | POA: Diagnosis not present

## 2014-02-13 DIAGNOSIS — M9904 Segmental and somatic dysfunction of sacral region: Secondary | ICD-10-CM | POA: Diagnosis not present

## 2014-02-13 DIAGNOSIS — M9903 Segmental and somatic dysfunction of lumbar region: Secondary | ICD-10-CM | POA: Diagnosis not present

## 2014-02-13 DIAGNOSIS — M9902 Segmental and somatic dysfunction of thoracic region: Secondary | ICD-10-CM | POA: Diagnosis not present

## 2014-02-13 DIAGNOSIS — M5441 Lumbago with sciatica, right side: Secondary | ICD-10-CM | POA: Diagnosis not present

## 2014-02-13 DIAGNOSIS — M546 Pain in thoracic spine: Secondary | ICD-10-CM | POA: Diagnosis not present

## 2014-02-13 DIAGNOSIS — M5134 Other intervertebral disc degeneration, thoracic region: Secondary | ICD-10-CM | POA: Diagnosis not present

## 2014-02-16 DIAGNOSIS — M6281 Muscle weakness (generalized): Secondary | ICD-10-CM | POA: Diagnosis not present

## 2014-02-16 DIAGNOSIS — M25551 Pain in right hip: Secondary | ICD-10-CM | POA: Diagnosis not present

## 2014-02-16 DIAGNOSIS — M545 Low back pain: Secondary | ICD-10-CM | POA: Diagnosis not present

## 2014-02-19 DIAGNOSIS — M545 Low back pain: Secondary | ICD-10-CM | POA: Diagnosis not present

## 2014-02-19 DIAGNOSIS — M6281 Muscle weakness (generalized): Secondary | ICD-10-CM | POA: Diagnosis not present

## 2014-02-19 DIAGNOSIS — M25551 Pain in right hip: Secondary | ICD-10-CM | POA: Diagnosis not present

## 2014-02-20 DIAGNOSIS — M546 Pain in thoracic spine: Secondary | ICD-10-CM | POA: Diagnosis not present

## 2014-02-20 DIAGNOSIS — M9904 Segmental and somatic dysfunction of sacral region: Secondary | ICD-10-CM | POA: Diagnosis not present

## 2014-02-20 DIAGNOSIS — M9902 Segmental and somatic dysfunction of thoracic region: Secondary | ICD-10-CM | POA: Diagnosis not present

## 2014-02-20 DIAGNOSIS — M5136 Other intervertebral disc degeneration, lumbar region: Secondary | ICD-10-CM | POA: Diagnosis not present

## 2014-02-20 DIAGNOSIS — M5441 Lumbago with sciatica, right side: Secondary | ICD-10-CM | POA: Diagnosis not present

## 2014-02-20 DIAGNOSIS — M5134 Other intervertebral disc degeneration, thoracic region: Secondary | ICD-10-CM | POA: Diagnosis not present

## 2014-02-20 DIAGNOSIS — M9903 Segmental and somatic dysfunction of lumbar region: Secondary | ICD-10-CM | POA: Diagnosis not present

## 2014-02-22 DIAGNOSIS — M545 Low back pain: Secondary | ICD-10-CM | POA: Diagnosis not present

## 2014-02-22 DIAGNOSIS — M6281 Muscle weakness (generalized): Secondary | ICD-10-CM | POA: Diagnosis not present

## 2014-02-22 DIAGNOSIS — M25551 Pain in right hip: Secondary | ICD-10-CM | POA: Diagnosis not present

## 2014-02-26 DIAGNOSIS — M25551 Pain in right hip: Secondary | ICD-10-CM | POA: Diagnosis not present

## 2014-02-26 DIAGNOSIS — M6281 Muscle weakness (generalized): Secondary | ICD-10-CM | POA: Diagnosis not present

## 2014-02-26 DIAGNOSIS — M545 Low back pain: Secondary | ICD-10-CM | POA: Diagnosis not present

## 2014-02-28 DIAGNOSIS — E119 Type 2 diabetes mellitus without complications: Secondary | ICD-10-CM | POA: Diagnosis not present

## 2014-02-28 DIAGNOSIS — Z23 Encounter for immunization: Secondary | ICD-10-CM | POA: Diagnosis not present

## 2014-02-28 DIAGNOSIS — M719 Bursopathy, unspecified: Secondary | ICD-10-CM | POA: Diagnosis not present

## 2014-03-01 DIAGNOSIS — M6281 Muscle weakness (generalized): Secondary | ICD-10-CM | POA: Diagnosis not present

## 2014-03-01 DIAGNOSIS — M545 Low back pain: Secondary | ICD-10-CM | POA: Diagnosis not present

## 2014-03-01 DIAGNOSIS — M25551 Pain in right hip: Secondary | ICD-10-CM | POA: Diagnosis not present

## 2014-03-13 DIAGNOSIS — M5134 Other intervertebral disc degeneration, thoracic region: Secondary | ICD-10-CM | POA: Diagnosis not present

## 2014-03-13 DIAGNOSIS — M9903 Segmental and somatic dysfunction of lumbar region: Secondary | ICD-10-CM | POA: Diagnosis not present

## 2014-03-13 DIAGNOSIS — M5441 Lumbago with sciatica, right side: Secondary | ICD-10-CM | POA: Diagnosis not present

## 2014-03-13 DIAGNOSIS — M9902 Segmental and somatic dysfunction of thoracic region: Secondary | ICD-10-CM | POA: Diagnosis not present

## 2014-03-13 DIAGNOSIS — M9904 Segmental and somatic dysfunction of sacral region: Secondary | ICD-10-CM | POA: Diagnosis not present

## 2014-03-13 DIAGNOSIS — M546 Pain in thoracic spine: Secondary | ICD-10-CM | POA: Diagnosis not present

## 2014-03-13 DIAGNOSIS — M5136 Other intervertebral disc degeneration, lumbar region: Secondary | ICD-10-CM | POA: Diagnosis not present

## 2014-03-21 DIAGNOSIS — M719 Bursopathy, unspecified: Secondary | ICD-10-CM | POA: Diagnosis not present

## 2014-03-21 DIAGNOSIS — E119 Type 2 diabetes mellitus without complications: Secondary | ICD-10-CM | POA: Diagnosis not present

## 2014-03-26 DIAGNOSIS — M5441 Lumbago with sciatica, right side: Secondary | ICD-10-CM | POA: Diagnosis not present

## 2014-03-26 DIAGNOSIS — M5134 Other intervertebral disc degeneration, thoracic region: Secondary | ICD-10-CM | POA: Diagnosis not present

## 2014-03-26 DIAGNOSIS — M9902 Segmental and somatic dysfunction of thoracic region: Secondary | ICD-10-CM | POA: Diagnosis not present

## 2014-03-26 DIAGNOSIS — M546 Pain in thoracic spine: Secondary | ICD-10-CM | POA: Diagnosis not present

## 2014-03-26 DIAGNOSIS — M5136 Other intervertebral disc degeneration, lumbar region: Secondary | ICD-10-CM | POA: Diagnosis not present

## 2014-03-26 DIAGNOSIS — M9904 Segmental and somatic dysfunction of sacral region: Secondary | ICD-10-CM | POA: Diagnosis not present

## 2014-03-26 DIAGNOSIS — M9903 Segmental and somatic dysfunction of lumbar region: Secondary | ICD-10-CM | POA: Diagnosis not present

## 2014-03-28 DIAGNOSIS — N401 Enlarged prostate with lower urinary tract symptoms: Secondary | ICD-10-CM | POA: Diagnosis not present

## 2014-03-28 DIAGNOSIS — R972 Elevated prostate specific antigen [PSA]: Secondary | ICD-10-CM | POA: Diagnosis not present

## 2014-04-12 ENCOUNTER — Encounter (HOSPITAL_COMMUNITY): Payer: Self-pay | Admitting: Internal Medicine

## 2014-04-20 DIAGNOSIS — G500 Trigeminal neuralgia: Secondary | ICD-10-CM | POA: Diagnosis not present

## 2014-04-20 DIAGNOSIS — E119 Type 2 diabetes mellitus without complications: Secondary | ICD-10-CM | POA: Diagnosis not present

## 2014-04-20 DIAGNOSIS — E785 Hyperlipidemia, unspecified: Secondary | ICD-10-CM | POA: Diagnosis not present

## 2014-04-20 DIAGNOSIS — I10 Essential (primary) hypertension: Secondary | ICD-10-CM | POA: Diagnosis not present

## 2014-04-20 DIAGNOSIS — I6789 Other cerebrovascular disease: Secondary | ICD-10-CM | POA: Diagnosis not present

## 2014-04-20 DIAGNOSIS — Z6823 Body mass index (BMI) 23.0-23.9, adult: Secondary | ICD-10-CM | POA: Diagnosis not present

## 2014-04-24 ENCOUNTER — Ambulatory Visit (INDEPENDENT_AMBULATORY_CARE_PROVIDER_SITE_OTHER): Payer: Medicare Other | Admitting: Internal Medicine

## 2014-04-24 ENCOUNTER — Encounter: Payer: Self-pay | Admitting: Internal Medicine

## 2014-04-24 VITALS — BP 160/84 | HR 78 | Ht 70.0 in | Wt 173.8 lb

## 2014-04-24 DIAGNOSIS — I255 Ischemic cardiomyopathy: Secondary | ICD-10-CM | POA: Diagnosis not present

## 2014-04-24 DIAGNOSIS — I495 Sick sinus syndrome: Secondary | ICD-10-CM

## 2014-04-24 DIAGNOSIS — Z45018 Encounter for adjustment and management of other part of cardiac pacemaker: Secondary | ICD-10-CM | POA: Diagnosis not present

## 2014-04-24 DIAGNOSIS — I10 Essential (primary) hypertension: Secondary | ICD-10-CM | POA: Diagnosis not present

## 2014-04-24 LAB — MDC_IDC_ENUM_SESS_TYPE_INCLINIC
Battery Impedance: 159 Ohm
Brady Statistic AP VP Percent: 3 %
Brady Statistic AP VS Percent: 96 %
Brady Statistic AS VS Percent: 1 %
Date Time Interrogation Session: 20151222151911
Lead Channel Impedance Value: 446 Ohm
Lead Channel Impedance Value: 516 Ohm
Lead Channel Pacing Threshold Pulse Width: 0.4 ms
Lead Channel Sensing Intrinsic Amplitude: 1 mV
MDC IDC MSMT BATTERY REMAINING LONGEVITY: 135 mo
MDC IDC MSMT BATTERY VOLTAGE: 2.8 V
MDC IDC MSMT LEADCHNL RA PACING THRESHOLD AMPLITUDE: 0.75 V
MDC IDC MSMT LEADCHNL RV PACING THRESHOLD AMPLITUDE: 0.5 V
MDC IDC MSMT LEADCHNL RV PACING THRESHOLD PULSEWIDTH: 0.4 ms
MDC IDC MSMT LEADCHNL RV SENSING INTR AMPL: 11.2 mV
MDC IDC SET LEADCHNL RA PACING AMPLITUDE: 2 V
MDC IDC SET LEADCHNL RV PACING AMPLITUDE: 2.5 V
MDC IDC SET LEADCHNL RV PACING PULSEWIDTH: 0.4 ms
MDC IDC SET LEADCHNL RV SENSING SENSITIVITY: 4 mV
MDC IDC STAT BRADY AS VP PERCENT: 0 %

## 2014-04-24 NOTE — Patient Instructions (Signed)
Your physician recommends that you continue on your current medications as directed. Please refer to the Current Medication list given to you today.  Your physician wants you to follow-up in: 12  Months with Dr. Klein You will receive a reminder letter in the mail two months in advance. If you don't receive a letter, please call our office to schedule the follow-up appointment.  

## 2014-04-24 NOTE — Progress Notes (Signed)
Patient Care Team: Zorita Pang, MD as PCP - General (Unknown Physician Specialty)   HPI  [name] is a 80 y.o. male Seen in followup for ICD which was down graded to a pacemaker 2013. It was implanted for ischemic heart disease. He has a history of bypass surgery and underwent catheterization most recently 2006 demonstrating ejection fraction of 35-40% with patent LIMA and patent vein graft to his posterior descending.   He underwent device generator replacement with downgrade to dual chamber pacemaker and capping high-voltage 6949-lead   The patient denies chest pain, shortness of breath, nocturnal dyspnea, orthopnea or peripheral edema. There have been no palpitations, lightheadedness or syncope.   His wife of 19 yrs died August 24, 2022 of recurrent CA which was "100% cured"  Its been very hard  BP at home 130-160   Past Medical History  Diagnosis Date  . Sinus node dysfunction   . Ischemic cardiomyopathy   . S/P implantation of automatic cardioverter/defibrillator (AICD)     For the above-Medtronic EnTrust  . Pacemaker     Nov. 2014  . Hypertension   . BPH (benign prostatic hyperplasia)   . Colon cancer     Partial resection    Past Surgical History  Procedure Laterality Date  . Cardiac defibrillator placement      Medtronic EnTrust  . Coronary artery bypass graft    . Colon cancer    . Implantable cardioverter defibrillator generator change N/A 03/24/2012    Procedure: IMPLANTABLE CARDIOVERTER DEFIBRILLATOR GENERATOR CHANGE;  Surgeon: Deboraha Sprang, MD;  Location: [facility] CATH LAB;  Service: Cardiovascular;  Laterality: N/A;    Current Outpatient Prescriptions  Medication Sig Dispense Refill  . amLODipine (NORVASC) 5 MG tablet Take 5 mg by mouth daily.     . benazepril (LOTENSIN) 10 MG tablet Take 10 mg by mouth daily.      . bethanechol (URECHOLINE) 50 MG tablet Take 1 tablet by mouth Twice daily.    . clopidogrel (PLAVIX) 75 MG tablet Take 1 tablet by mouth  daily.    . EPITOL 200 MG tablet Take 100 mg by mouth Twice daily.     . finasteride (PROSCAR) 5 MG tablet     . GLUCOSAMINE-CHONDROITIN PO Take by mouth 2 (two) times daily.    . metoprolol (LOPRESSOR) 50 MG tablet Take 25 mg by mouth 2 (two) times daily.     . Multiple Vitamins-Minerals (CENTRUM) tablet Take 1 tablet by mouth daily.      . Tamsulosin HCl (FLOMAX) 0.4 MG CAPS Take 1 tablet by mouth 2 (two) times daily.     . traMADol (ULTRAM) 50 MG tablet Take 50 mg by mouth every 8 (eight) hours as needed. Pain.    . vitamin B-12 (CYANOCOBALAMIN) 500 MCG tablet Take 500 mcg by mouth daily.       No current facility-administered medications for this visit.    Allergies  Allergen Reactions  . Carvedilol     REACTION: multiple myalgias  . Celecoxib     Review of Systems negative except from HPI and PMH  Physical Exam BP 160/84 mmHg  Pulse 78  Ht 5\' 10"  (1.778 m)  Wt 173 lb 12.8 oz (78.835 kg)  BMI 24.94 kg/m2 Well developed and well nourished in no acute distress HENT normal E scleral and icterus clear Neck Supple JVP flat; carotids brisk and full Device pocket well healed; without hematoma or erythema.  There is no tethering  Clear to ausculation  Regular rate and rhythm, no murmurs gallops or rub Soft with active bowel sounds No clubbing cyanosis no Edema Alert and oriented, Skin Warm and Dry    Assessment and  Plan  Sinus node dysfunction  1AVblock  Pacemaker Medtronic  The patient's device was interrogated.  The information was reviewed. No changes were made in the programming.     Ischemic cardiomyopathy   Without symptoms of ischemia  Hypertension  Poorly  Controlled  i have asked him to check BP 3/week for two weeks then followup with Dr Micheal Likens  I have told him that the appropriated target is 130-140    Grief  Dealing with the death of his wife after 5 yrs

## 2014-05-22 DIAGNOSIS — H26493 Other secondary cataract, bilateral: Secondary | ICD-10-CM | POA: Diagnosis not present

## 2014-05-22 DIAGNOSIS — R441 Visual hallucinations: Secondary | ICD-10-CM | POA: Diagnosis not present

## 2014-05-22 DIAGNOSIS — H1851 Endothelial corneal dystrophy: Secondary | ICD-10-CM | POA: Diagnosis not present

## 2014-06-04 DIAGNOSIS — R0602 Shortness of breath: Secondary | ICD-10-CM | POA: Diagnosis not present

## 2014-06-04 DIAGNOSIS — E119 Type 2 diabetes mellitus without complications: Secondary | ICD-10-CM | POA: Diagnosis not present

## 2014-06-04 DIAGNOSIS — I2510 Atherosclerotic heart disease of native coronary artery without angina pectoris: Secondary | ICD-10-CM | POA: Diagnosis not present

## 2014-06-04 DIAGNOSIS — G500 Trigeminal neuralgia: Secondary | ICD-10-CM | POA: Diagnosis not present

## 2014-06-04 DIAGNOSIS — I10 Essential (primary) hypertension: Secondary | ICD-10-CM | POA: Diagnosis not present

## 2014-06-04 DIAGNOSIS — E785 Hyperlipidemia, unspecified: Secondary | ICD-10-CM | POA: Diagnosis not present

## 2014-06-04 DIAGNOSIS — Z6826 Body mass index (BMI) 26.0-26.9, adult: Secondary | ICD-10-CM | POA: Diagnosis not present

## 2014-06-04 DIAGNOSIS — N401 Enlarged prostate with lower urinary tract symptoms: Secondary | ICD-10-CM | POA: Diagnosis not present

## 2014-06-07 DIAGNOSIS — M5134 Other intervertebral disc degeneration, thoracic region: Secondary | ICD-10-CM | POA: Diagnosis not present

## 2014-06-07 DIAGNOSIS — M9902 Segmental and somatic dysfunction of thoracic region: Secondary | ICD-10-CM | POA: Diagnosis not present

## 2014-06-07 DIAGNOSIS — M1990 Unspecified osteoarthritis, unspecified site: Secondary | ICD-10-CM | POA: Diagnosis not present

## 2014-06-07 DIAGNOSIS — M5441 Lumbago with sciatica, right side: Secondary | ICD-10-CM | POA: Diagnosis not present

## 2014-06-07 DIAGNOSIS — M9904 Segmental and somatic dysfunction of sacral region: Secondary | ICD-10-CM | POA: Diagnosis not present

## 2014-06-07 DIAGNOSIS — M546 Pain in thoracic spine: Secondary | ICD-10-CM | POA: Diagnosis not present

## 2014-06-07 DIAGNOSIS — N289 Disorder of kidney and ureter, unspecified: Secondary | ICD-10-CM | POA: Diagnosis not present

## 2014-06-07 DIAGNOSIS — G500 Trigeminal neuralgia: Secondary | ICD-10-CM | POA: Diagnosis not present

## 2014-06-07 DIAGNOSIS — R0602 Shortness of breath: Secondary | ICD-10-CM | POA: Diagnosis not present

## 2014-06-07 DIAGNOSIS — M9903 Segmental and somatic dysfunction of lumbar region: Secondary | ICD-10-CM | POA: Diagnosis not present

## 2014-06-07 DIAGNOSIS — I2510 Atherosclerotic heart disease of native coronary artery without angina pectoris: Secondary | ICD-10-CM | POA: Diagnosis not present

## 2014-06-07 DIAGNOSIS — M5136 Other intervertebral disc degeneration, lumbar region: Secondary | ICD-10-CM | POA: Diagnosis not present

## 2014-06-07 DIAGNOSIS — Z7189 Other specified counseling: Secondary | ICD-10-CM | POA: Diagnosis not present

## 2014-06-11 DIAGNOSIS — N289 Disorder of kidney and ureter, unspecified: Secondary | ICD-10-CM | POA: Diagnosis not present

## 2014-06-11 DIAGNOSIS — R0602 Shortness of breath: Secondary | ICD-10-CM | POA: Diagnosis not present

## 2014-06-11 DIAGNOSIS — R5383 Other fatigue: Secondary | ICD-10-CM | POA: Diagnosis not present

## 2014-06-11 DIAGNOSIS — G500 Trigeminal neuralgia: Secondary | ICD-10-CM | POA: Diagnosis not present

## 2014-06-11 DIAGNOSIS — I429 Cardiomyopathy, unspecified: Secondary | ICD-10-CM | POA: Diagnosis not present

## 2014-06-11 DIAGNOSIS — E785 Hyperlipidemia, unspecified: Secondary | ICD-10-CM | POA: Diagnosis not present

## 2014-06-26 DIAGNOSIS — R0602 Shortness of breath: Secondary | ICD-10-CM | POA: Diagnosis not present

## 2014-06-26 DIAGNOSIS — R531 Weakness: Secondary | ICD-10-CM | POA: Diagnosis not present

## 2014-06-26 DIAGNOSIS — I429 Cardiomyopathy, unspecified: Secondary | ICD-10-CM | POA: Diagnosis not present

## 2014-06-26 DIAGNOSIS — J301 Allergic rhinitis due to pollen: Secondary | ICD-10-CM | POA: Diagnosis not present

## 2014-06-28 DIAGNOSIS — Z6825 Body mass index (BMI) 25.0-25.9, adult: Secondary | ICD-10-CM | POA: Diagnosis not present

## 2014-06-28 DIAGNOSIS — I429 Cardiomyopathy, unspecified: Secondary | ICD-10-CM | POA: Diagnosis not present

## 2014-06-28 DIAGNOSIS — J301 Allergic rhinitis due to pollen: Secondary | ICD-10-CM | POA: Diagnosis not present

## 2014-07-20 DIAGNOSIS — N401 Enlarged prostate with lower urinary tract symptoms: Secondary | ICD-10-CM | POA: Diagnosis not present

## 2014-07-20 DIAGNOSIS — E785 Hyperlipidemia, unspecified: Secondary | ICD-10-CM | POA: Diagnosis not present

## 2014-07-20 DIAGNOSIS — Z6824 Body mass index (BMI) 24.0-24.9, adult: Secondary | ICD-10-CM | POA: Diagnosis not present

## 2014-07-20 DIAGNOSIS — M1990 Unspecified osteoarthritis, unspecified site: Secondary | ICD-10-CM | POA: Diagnosis not present

## 2014-07-20 DIAGNOSIS — E119 Type 2 diabetes mellitus without complications: Secondary | ICD-10-CM | POA: Diagnosis not present

## 2014-07-20 DIAGNOSIS — G500 Trigeminal neuralgia: Secondary | ICD-10-CM | POA: Diagnosis not present

## 2014-07-20 DIAGNOSIS — I429 Cardiomyopathy, unspecified: Secondary | ICD-10-CM | POA: Diagnosis not present

## 2014-07-24 ENCOUNTER — Ambulatory Visit (INDEPENDENT_AMBULATORY_CARE_PROVIDER_SITE_OTHER): Payer: Medicare Other | Admitting: *Deleted

## 2014-07-24 ENCOUNTER — Telehealth: Payer: Self-pay | Admitting: Cardiology

## 2014-07-24 DIAGNOSIS — I495 Sick sinus syndrome: Secondary | ICD-10-CM

## 2014-07-24 LAB — MDC_IDC_ENUM_SESS_TYPE_REMOTE
Battery Impedance: 159 Ohm
Battery Voltage: 2.8 V
Brady Statistic AS VP Percent: 1 %
Brady Statistic AS VS Percent: 7 %
Lead Channel Pacing Threshold Amplitude: 0.5 V
Lead Channel Pacing Threshold Pulse Width: 0.4 ms
Lead Channel Pacing Threshold Pulse Width: 0.4 ms
Lead Channel Setting Pacing Amplitude: 2 V
Lead Channel Setting Pacing Amplitude: 2.5 V
Lead Channel Setting Sensing Sensitivity: 2.8 mV
MDC IDC MSMT BATTERY REMAINING LONGEVITY: 117 mo
MDC IDC MSMT LEADCHNL RA IMPEDANCE VALUE: 446 Ohm
MDC IDC MSMT LEADCHNL RA PACING THRESHOLD AMPLITUDE: 0.375 V
MDC IDC MSMT LEADCHNL RV IMPEDANCE VALUE: 423 Ohm
MDC IDC MSMT LEADCHNL RV SENSING INTR AMPL: 5.6 mV
MDC IDC SESS DTM: 20160322165441
MDC IDC SET LEADCHNL RV PACING PULSEWIDTH: 0.4 ms
MDC IDC STAT BRADY AP VP PERCENT: 4 %
MDC IDC STAT BRADY AP VS PERCENT: 89 %

## 2014-07-24 NOTE — Telephone Encounter (Signed)
Spoke with pt and reminded pt of remote transmission that is due today. Pt verbalized understanding.   

## 2014-07-24 NOTE — Progress Notes (Signed)
Remote pacemaker transmission.   

## 2014-07-26 DIAGNOSIS — E119 Type 2 diabetes mellitus without complications: Secondary | ICD-10-CM | POA: Diagnosis not present

## 2014-07-26 DIAGNOSIS — G500 Trigeminal neuralgia: Secondary | ICD-10-CM | POA: Diagnosis not present

## 2014-07-27 ENCOUNTER — Telehealth: Payer: Self-pay | Admitting: *Deleted

## 2014-07-27 NOTE — Telephone Encounter (Signed)
Called patient regarding persistent AF beginning on 05/31/14. Patient denies any SOB, LE edema, or CP at present. He states that he had been having sx's of SOB/Edema x 1 month ago, so Dr.Gage Rx'd Lasix--sx's have since resolved. Will inform Dr.Klein/Sherri Price.

## 2014-08-01 NOTE — Telephone Encounter (Signed)
Late Entry: Reviewed with Dr. Caryl Comes 3/25. Patient scheduled to see him on 3/31

## 2014-08-02 ENCOUNTER — Encounter: Payer: Self-pay | Admitting: Internal Medicine

## 2014-08-02 ENCOUNTER — Ambulatory Visit (INDEPENDENT_AMBULATORY_CARE_PROVIDER_SITE_OTHER): Payer: Medicare Other | Admitting: Internal Medicine

## 2014-08-02 VITALS — BP 138/88 | HR 87 | Ht 70.0 in | Wt 170.0 lb

## 2014-08-02 DIAGNOSIS — Z45018 Encounter for adjustment and management of other part of cardiac pacemaker: Secondary | ICD-10-CM

## 2014-08-02 DIAGNOSIS — I495 Sick sinus syndrome: Secondary | ICD-10-CM | POA: Diagnosis not present

## 2014-08-02 NOTE — Patient Instructions (Signed)
A list of NOACs (blood thinners) were given to you.  Please check into cost effectiveness.   Once you have started and been on a blood thinner for 4 weeks call us to arrange a cardioversion.

## 2014-08-02 NOTE — Progress Notes (Signed)
Patient Care Team: Zorita Pang, MD as PCP - General (Unknown Physician Specialty)   HPI  [name] is a 81 y.o. male Seen in followup for ICD which was down graded to a pacemaker 2013. It was implanted for ischemic heart disease. He has a history of bypass surgery and underwent catheterization most recently 2006 demonstrating ejection fraction of 35-40% with patent LIMA and patent vein graft to his posterior descending.   He underwent device generator replacement with downgrade to dual chamber pacemaker and capping high-voltage 6949-lead  He is seen today as an add-on because remote interrogation demonstrated a new identification of atrial fibrillation/flutter. He is noted initially some shortness of breath that was treated by an increased dose of diuretics by his PCP. He is also noted sleepiness .   His wife of 91 yrs died 09-09-22 of recurrent CA which was "100% cured"  Its been very hard  BP at home 130-160   Past Medical History  Diagnosis Date  . Sinus node dysfunction   . Ischemic cardiomyopathy   . S/P implantation of automatic cardioverter/defibrillator (AICD)     For the above-Medtronic EnTrust  . Pacemaker     Nov. 2014  . Hypertension   . BPH (benign prostatic hyperplasia)   . Colon cancer     Partial resection    Past Surgical History  Procedure Laterality Date  . Cardiac defibrillator placement      Medtronic EnTrust  . Coronary artery bypass graft    . Colon cancer    . Implantable cardioverter defibrillator generator change N/A 03/24/2012    Procedure: IMPLANTABLE CARDIOVERTER DEFIBRILLATOR GENERATOR CHANGE;  Surgeon: Deboraha Sprang, MD;  Location: [facility] CATH LAB;  Service: Cardiovascular;  Laterality: N/A;    Current Outpatient Prescriptions  Medication Sig Dispense Refill  . benazepril (LOTENSIN) 10 MG tablet Take 10 mg by mouth daily.      . bethanechol (URECHOLINE) 50 MG tablet Take 1 tablet by mouth Twice daily.    . clopidogrel (PLAVIX) 75 MG  tablet Take 1 tablet by mouth daily.    . EPITOL 200 MG tablet Take 100 mg by mouth Twice daily.     . finasteride (PROSCAR) 5 MG tablet     . furosemide (LASIX) 40 MG tablet 2 (two) times daily.    Marland Kitchen GLUCOSAMINE-CHONDROITIN PO Take by mouth 2 (two) times daily.    . metoprolol (LOPRESSOR) 50 MG tablet Take 25 mg by mouth 2 (two) times daily.     . Multiple Vitamins-Minerals (CENTRUM) tablet Take 1 tablet by mouth daily.      . Tamsulosin HCl (FLOMAX) 0.4 MG CAPS Take 1 tablet by mouth 2 (two) times daily.     . traMADol (ULTRAM) 50 MG tablet Take 50 mg by mouth every 8 (eight) hours as needed. Pain.    . vitamin B-12 (CYANOCOBALAMIN) 500 MCG tablet Take 500 mcg by mouth daily.       No current facility-administered medications for this visit.    Allergies  Allergen Reactions  . Carvedilol     REACTION: multiple myalgias  . Celecoxib     Review of Systems negative except from HPI and PMH  Physical Exam BP 138/88 mmHg  Pulse 87  Ht 5\' 10"  (1.778 m)  Wt 170 lb (77.111 kg)  BMI 24.39 kg/m2 Well developed and well nourished in no acute distress HENT normal E scleral and icterus clear Neck Supple JVP flat; carotids brisk and full Device pocket  well healed; without hematoma or erythema.  There is no tethering  Clear to ausculation  Regular rate and rhythm, no murmurs gallops or rub Soft with active bowel sounds No clubbing cyanosis no Edema Alert and oriented, Skin Warm and Dry  ECG atrial fib at 75 or so with intermittent V pacing  Assessment and  Plan  Sinus node dysfunction  Atrial flutter/fibrillation  1AVblock  Pacemaker Medtronic  The patient's device was interrogated.  The information was reviewed. No changes were made in the programming.     Ischemic cardiomyopathy   Without symptoms of ischemia  Hypertension  Better controlled  He is newly identified atrial fibrillation. There is some symptoms associated with it initially shortness of breath treated with  diuretics but also some fatigue. We will anticipate undertaking cardioversion followed anticoagulation.  He is currently on Plavix. He will need to be started on an anticoagulant. We discussed the use of the NOACs compared to Coumadin. We briefly reviewed the data of at least comparability in stroke prevention, bleeding and outcome. We discussed some of the new once wherein somewhat associated with decreased ischemic stroke risk, one to be taken daily, and has been shown to be comparable and bleeding risk to aspirin.  We also discussed bleeding associated with warfarin as well as NOACs and a wall bleeding as a complication of all these drugs intracranial bleeding is more frequently associated with warfarin then the NOACs and a GI bleeding is more commonly associated with the latter  He will look into the cost of NOACs and then follow-up with his PCP to proceed with a NOAC or to begin treatment with Coumadin  Blood pressure well controlled

## 2014-08-03 DIAGNOSIS — I429 Cardiomyopathy, unspecified: Secondary | ICD-10-CM | POA: Diagnosis not present

## 2014-08-03 DIAGNOSIS — G500 Trigeminal neuralgia: Secondary | ICD-10-CM | POA: Diagnosis not present

## 2014-08-03 DIAGNOSIS — I4891 Unspecified atrial fibrillation: Secondary | ICD-10-CM | POA: Diagnosis not present

## 2014-08-03 DIAGNOSIS — E119 Type 2 diabetes mellitus without complications: Secondary | ICD-10-CM | POA: Diagnosis not present

## 2014-08-03 DIAGNOSIS — M1990 Unspecified osteoarthritis, unspecified site: Secondary | ICD-10-CM | POA: Diagnosis not present

## 2014-08-03 DIAGNOSIS — E785 Hyperlipidemia, unspecified: Secondary | ICD-10-CM | POA: Diagnosis not present

## 2014-08-03 DIAGNOSIS — I2510 Atherosclerotic heart disease of native coronary artery without angina pectoris: Secondary | ICD-10-CM | POA: Diagnosis not present

## 2014-08-03 DIAGNOSIS — N401 Enlarged prostate with lower urinary tract symptoms: Secondary | ICD-10-CM | POA: Diagnosis not present

## 2014-08-08 DIAGNOSIS — R609 Edema, unspecified: Secondary | ICD-10-CM | POA: Diagnosis not present

## 2014-08-08 DIAGNOSIS — E119 Type 2 diabetes mellitus without complications: Secondary | ICD-10-CM | POA: Diagnosis not present

## 2014-08-08 DIAGNOSIS — G500 Trigeminal neuralgia: Secondary | ICD-10-CM | POA: Diagnosis not present

## 2014-08-08 DIAGNOSIS — E785 Hyperlipidemia, unspecified: Secondary | ICD-10-CM | POA: Diagnosis not present

## 2014-08-08 DIAGNOSIS — D649 Anemia, unspecified: Secondary | ICD-10-CM | POA: Diagnosis not present

## 2014-08-08 DIAGNOSIS — I4891 Unspecified atrial fibrillation: Secondary | ICD-10-CM | POA: Diagnosis not present

## 2014-08-08 DIAGNOSIS — I2510 Atherosclerotic heart disease of native coronary artery without angina pectoris: Secondary | ICD-10-CM | POA: Diagnosis not present

## 2014-08-09 DIAGNOSIS — M5136 Other intervertebral disc degeneration, lumbar region: Secondary | ICD-10-CM | POA: Diagnosis not present

## 2014-08-09 DIAGNOSIS — M9903 Segmental and somatic dysfunction of lumbar region: Secondary | ICD-10-CM | POA: Diagnosis not present

## 2014-08-09 DIAGNOSIS — M546 Pain in thoracic spine: Secondary | ICD-10-CM | POA: Diagnosis not present

## 2014-08-09 DIAGNOSIS — M9904 Segmental and somatic dysfunction of sacral region: Secondary | ICD-10-CM | POA: Diagnosis not present

## 2014-08-09 DIAGNOSIS — M5441 Lumbago with sciatica, right side: Secondary | ICD-10-CM | POA: Diagnosis not present

## 2014-08-09 DIAGNOSIS — M5134 Other intervertebral disc degeneration, thoracic region: Secondary | ICD-10-CM | POA: Diagnosis not present

## 2014-08-09 DIAGNOSIS — M9902 Segmental and somatic dysfunction of thoracic region: Secondary | ICD-10-CM | POA: Diagnosis not present

## 2014-08-21 ENCOUNTER — Encounter: Payer: Self-pay | Admitting: Internal Medicine

## 2014-08-23 ENCOUNTER — Encounter: Payer: Self-pay | Admitting: Internal Medicine

## 2014-09-04 ENCOUNTER — Telehealth: Payer: Self-pay | Admitting: Internal Medicine

## 2014-09-04 NOTE — Telephone Encounter (Signed)
Arrange DCCV for 5/5. Patient will stop by our office before going to hospital to have EKG performed to determine rhythm. Patient advised that if he is back in NSR we would cancel the procedure. Pre procedure labs to be done at hospital prior to procedure. (patient lives an hour [place] a half away). Aware NPO after midnight Wednesday. Patient verbalized understanding and agreeable to plan.

## 2014-09-04 NOTE — Telephone Encounter (Signed)
New Message   Patient is calling to speak to nurse, please give a call back.

## 2014-09-06 ENCOUNTER — Telehealth: Payer: Self-pay | Admitting: *Deleted

## 2014-09-06 ENCOUNTER — Encounter (HOSPITAL_COMMUNITY): Payer: Self-pay | Admitting: Critical Care Medicine

## 2014-09-06 ENCOUNTER — Ambulatory Visit (HOSPITAL_COMMUNITY): Payer: Medicare Other | Admitting: Critical Care Medicine

## 2014-09-06 ENCOUNTER — Ambulatory Visit (HOSPITAL_COMMUNITY): Payer: Medicare Other

## 2014-09-06 ENCOUNTER — Ambulatory Visit (HOSPITAL_COMMUNITY)
Admission: RE | Admit: 2014-09-06 | Discharge: 2014-09-06 | Disposition: A | Discharge status: Home / Self Care | Payer: Medicare Other | Source: Ambulatory Visit | Attending: Internal Medicine | Admitting: Internal Medicine

## 2014-09-06 ENCOUNTER — Encounter (HOSPITAL_COMMUNITY)
Admission: RE | Disposition: A | Discharge status: Home / Self Care | Payer: Self-pay | Source: Ambulatory Visit | Attending: Internal Medicine

## 2014-09-06 DIAGNOSIS — Z951 Presence of aortocoronary bypass graft: Secondary | ICD-10-CM | POA: Insufficient documentation

## 2014-09-06 DIAGNOSIS — I340 Nonrheumatic mitral (valve) insufficiency: Secondary | ICD-10-CM | POA: Diagnosis not present

## 2014-09-06 DIAGNOSIS — I509 Heart failure, unspecified: Secondary | ICD-10-CM | POA: Diagnosis not present

## 2014-09-06 DIAGNOSIS — I10 Essential (primary) hypertension: Secondary | ICD-10-CM | POA: Insufficient documentation

## 2014-09-06 DIAGNOSIS — I2510 Atherosclerotic heart disease of native coronary artery without angina pectoris: Secondary | ICD-10-CM | POA: Diagnosis not present

## 2014-09-06 DIAGNOSIS — I4892 Unspecified atrial flutter: Secondary | ICD-10-CM | POA: Insufficient documentation

## 2014-09-06 DIAGNOSIS — Z87891 Personal history of nicotine dependence: Secondary | ICD-10-CM | POA: Insufficient documentation

## 2014-09-06 DIAGNOSIS — Z950 Presence of cardiac pacemaker: Secondary | ICD-10-CM | POA: Diagnosis not present

## 2014-09-06 DIAGNOSIS — I255 Ischemic cardiomyopathy: Secondary | ICD-10-CM | POA: Insufficient documentation

## 2014-09-06 DIAGNOSIS — I4891 Unspecified atrial fibrillation: Secondary | ICD-10-CM | POA: Diagnosis not present

## 2014-09-06 DIAGNOSIS — I481 Persistent atrial fibrillation: Secondary | ICD-10-CM | POA: Diagnosis not present

## 2014-09-06 DIAGNOSIS — I495 Sick sinus syndrome: Secondary | ICD-10-CM | POA: Diagnosis not present

## 2014-09-06 DIAGNOSIS — I4819 Other persistent atrial fibrillation: Secondary | ICD-10-CM | POA: Insufficient documentation

## 2014-09-06 HISTORY — PX: TEE WITHOUT CARDIOVERSION: SHX5443

## 2014-09-06 HISTORY — PX: CARDIOVERSION: SHX1299

## 2014-09-06 LAB — POCT I-STAT, CHEM 8
BUN: 27 mg/dL — AB (ref 6–20)
Calcium, Ion: 1.22 mmol/L (ref 1.13–1.30)
Chloride: 96 mmol/L — ABNORMAL LOW (ref 101–111)
Creatinine, Ser: 1 mg/dL (ref 0.61–1.24)
Glucose, Bld: 182 mg/dL — ABNORMAL HIGH (ref 70–99)
HCT: 38 % — ABNORMAL LOW (ref 39.0–52.0)
Hemoglobin: 12.9 g/dL — ABNORMAL LOW (ref 13.0–17.0)
Potassium: 4.9 mmol/L (ref 3.5–5.1)
Sodium: 137 mmol/L (ref 135–145)
TCO2: 28 mmol/L (ref 0–100)

## 2014-09-06 LAB — POCT I-STAT 4, (NA,K, GLUC, HGB,HCT)
GLUCOSE: 177 mg/dL — AB (ref 70–99)
HEMATOCRIT: 37 % — AB (ref 39.0–52.0)
Hemoglobin: 12.6 g/dL — ABNORMAL LOW (ref 13.0–17.0)
Potassium: 4.9 mmol/L (ref 3.5–5.1)
Sodium: 137 mmol/L (ref 135–145)

## 2014-09-06 LAB — CBC
HCT: 35.3 % — ABNORMAL LOW (ref 39.0–52.0)
Hemoglobin: 12 g/dL — ABNORMAL LOW (ref 13.0–17.0)
MCH: 32.7 pg (ref 26.0–34.0)
MCHC: 34 g/dL (ref 30.0–36.0)
MCV: 96.2 fL (ref 78.0–100.0)
PLATELETS: 186 10*3/uL (ref 150–400)
RBC: 3.67 MIL/uL — AB (ref 4.22–5.81)
RDW: 13.6 % (ref 11.5–15.5)
WBC: 6.2 10*3/uL (ref 4.0–10.5)

## 2014-09-06 SURGERY — CARDIOVERSION
Anesthesia: General

## 2014-09-06 MED ORDER — LACTATED RINGERS IV SOLN
INTRAVENOUS | Status: DC
  Administered 2014-09-06: 14:00:00 via INTRAVENOUS
  Administered 2014-09-06: 1000 mL via INTRAVENOUS

## 2014-09-06 MED ORDER — PROPOFOL INFUSION 10 MG/ML OPTIME
INTRAVENOUS | Status: DC | PRN
  Administered 2014-09-06: 50 ug/kg/min via INTRAVENOUS

## 2014-09-06 MED ORDER — PROPOFOL 10 MG/ML IV BOLUS
INTRAVENOUS | Status: DC | PRN
  Administered 2014-09-06: 10 mg via INTRAVENOUS
  Administered 2014-09-06: 20 mg via INTRAVENOUS
  Administered 2014-09-06: 40 mg via INTRAVENOUS

## 2014-09-06 MED ORDER — APIXABAN 5 MG PO TABS: 5.0000 mg | ORAL_TABLET | Freq: Two times a day (BID) | ORAL | Status: DC

## 2014-09-06 NOTE — Anesthesia Procedure Notes (Signed)
Procedure Name: MAC Date/Time: 09/06/2014 1:35 PM Performed by: Merrilyn Puma B Pre-anesthesia Checklist: Patient identified, Timeout performed, Emergency Drugs available, Suction available and Patient being monitored Patient Re-evaluated:Patient Re-evaluated prior to inductionOxygen Delivery Method: Nasal cannula Intubation Type: IV induction Placement Confirmation: positive ETCO2 and breath sounds checked- equal and bilateral Dental Injury: Teeth and Oropharynx as per pre-operative assessment

## 2014-09-06 NOTE — Anesthesia Preprocedure Evaluation (Addendum)
Anesthesia Evaluation  Patient identified by MRN, date of birth, ID band Patient awake    Reviewed: Allergy & Precautions, NPO status , Patient's Chart, lab work & pertinent test results  Airway Mallampati: I  TM Distance: >3 FB Neck ROM: Full    Dental  (+) Upper Dentures, Lower Dentures   Pulmonary former smoker,  breath sounds clear to auscultation        Cardiovascular hypertension, + CAD and +CHF + pacemaker + Cardiac Defibrillator Rhythm:Irregular Rate:Normal     Neuro/Psych    GI/Hepatic   Endo/Other    Renal/GU      Musculoskeletal   Abdominal   Peds  Hematology   Anesthesia Other Findings   Reproductive/Obstetrics                            Anesthesia Physical Anesthesia Plan  ASA: III  Anesthesia Plan: General   Post-op Pain Management:    Induction: Intravenous  Airway Management Planned: Mask  Additional Equipment:   Intra-op Plan:   Post-operative Plan: Extubation in OR  Informed Consent: I have reviewed the patients History and Physical, chart, labs and discussed the procedure including the risks, benefits and alternatives for the proposed anesthesia with the patient or authorized representative who has indicated his/her understanding and acceptance.   Dental advisory given  Plan Discussed with:   Anesthesia Plan Comments:         Anesthesia Quick Evaluation

## 2014-09-06 NOTE — Anesthesia Postprocedure Evaluation (Addendum)
  Anesthesia Post-op Note  Patient: [name]  Procedure(s) Performed: Procedure(s): CARDIOVERSION (N/A) TRANSESOPHAGEAL ECHOCARDIOGRAM (TEE) (N/A)  Patient Location: Endoscopy Unit  Anesthesia Type:MAC  Level of Consciousness: awake, alert  and oriented  Airway and Oxygen Therapy: Patient Spontanous Breathing and Patient connected to nasal cannula oxygen  Post-op Pain: none  Post-op Assessment: Post-op Vital signs reviewed, Patient's Cardiovascular Status Stable, Respiratory Function Stable, Patent Airway and No signs of Nausea or vomiting  Post-op Vital Signs: Reviewed and stable  Last Vitals:  Filed Vitals:   09/06/14 1345  BP:   Pulse:   Temp:   Resp: 17   BP 155/82, RR 17, Sats 100% on 2L [place], HR 70 pacing Complications: No apparent anesthesia complications

## 2014-09-06 NOTE — Discharge Instructions (Signed)
Transesophageal Echocardiogram Transesophageal echocardiography (TEE) is a picture test of your heart using sound waves. The pictures taken can give very detailed pictures of your heart. This can help your doctor see if there are problems with your heart. TEE can check:  If your heart has blood clots in it.  How well your heart valves are working.  If you have an infection on the inside of your heart.  Some of the major arteries of your heart.  If your heart valve is working after a Office manager.  Your heart before a procedure that uses a shock to your heart to get the rhythm back to normal. BEFORE THE PROCEDURE  Do not eat or drink for 6 hours before the procedure or as told by your doctor.  Make plans to have someone drive you home after the procedure. Do not drive yourself home.  An IV tube will be put in your arm. PROCEDURE  You will be given a medicine to help you relax (sedative). It will be given through the IV tube.  A numbing medicine will be sprayed or gargled in the back of your throat to help numb it.  The tip of the probe is placed into the back of your mouth. You will be asked to swallow. This helps to pass the probe into your esophagus.  Once the tip of the probe is in the right place, your doctor can take pictures of your heart.  You may feel pressure at the back of your throat. AFTER THE PROCEDURE  You will be taken to a recovery area so the sedative can wear off.  Your throat may be sore and scratchy. This will go away slowly over time.  You will go home when you are fully awake and able to swallow liquids.  You should have someone stay with you for the next 24 hours.  Do not drive or operate machinery for the next 24 hours. Document Released: 02/15/2009 Document Revised: 04/25/2013 Document Reviewed: 10/20/2012 [facility] Patient Information 2015 [place], [place]. This information is not intended to replace advice given to you by your health care provider. Make  sure you discuss any questions you have with your health care provider.   Conscious Sedation, Adult, Care After Refer to this sheet in the next few weeks. These instructions provide you with information on caring for yourself after your procedure. Your health care provider may also give you more specific instructions. Your treatment has been planned according to current medical practices, but problems sometimes occur. Call your health care provider if you have any problems or questions after your procedure. WHAT TO EXPECT AFTER THE PROCEDURE  After your procedure:  You may feel sleepy, clumsy, and have poor balance for several hours.  Vomiting may occur if you eat too soon after the procedure. HOME CARE INSTRUCTIONS  Do not participate in any activities where you could become injured for at least 24 hours. Do not:  Drive.  Swim.  Ride a bicycle.  Operate heavy machinery.  Cook.  Use power tools.  Climb ladders.  Work from a high place.  Do not make important decisions or sign legal documents until you are improved.  If you vomit, drink water, juice, or soup when you can drink without vomiting. Make sure you have little or no nausea before eating solid foods.  Only take over-the-counter or prescription medicines for pain, discomfort, or fever as directed by your health care provider.  Make sure you and your family fully understand everything about  the medicines given to you, including what side effects may occur.  You should not drink alcohol, take sleeping pills, or take medicines that cause drowsiness for at least 24 hours.  If you smoke, do not smoke without supervision.  If you are feeling better, you may resume normal activities 24 hours after you were sedated.  Keep all appointments with your health care provider. SEEK MEDICAL CARE IF:  Your skin is pale or bluish in color.  You continue to feel nauseous or vomit.  Your pain is getting worse and is not helped  by medicine.  You have bleeding or swelling.  You are still sleepy or feeling clumsy after 24 hours. SEEK IMMEDIATE MEDICAL CARE IF:  You develop a rash.  You have difficulty breathing.  You develop any type of allergic problem.  You have a fever. MAKE SURE YOU:  Understand these instructions.  Will watch your condition.  Will get help right away if you are not doing well or get worse. Document Released: 02/08/2013 Document Reviewed: 02/08/2013   Electrical Cardioversion, Care After Refer to this sheet in the next few weeks. These instructions provide you with information on caring for yourself after your procedure. Your health care provider may also give you more specific instructions. Your treatment has been planned according to current medical practices, but problems sometimes occur. Call your health care provider if you have any problems or questions after your procedure. WHAT TO EXPECT AFTER THE PROCEDURE After your procedure, it is typical to have the following sensations:  Some redness on the skin where the shocks were delivered. If this is tender, a sunburn lotion or hydrocortisone cream may help.  Possible return of an abnormal heart rhythm within hours or days after the procedure. HOME CARE INSTRUCTIONS  Take medicines only as directed by your health care provider. Be sure you understand how and when to take your medicine.  Learn how to feel your pulse and check it often.  Limit your activity for 48 hours after the procedure or as directed by your health care provider.  Avoid or minimize caffeine and other stimulants as directed by your health care provider. SEEK MEDICAL CARE IF:  You feel like your heart is beating too fast or your pulse is not regular.  You have any questions about your medicines.  You have bleeding that will not stop. SEEK IMMEDIATE MEDICAL CARE IF:  You are dizzy or feel faint.  It is hard to breathe or you feel short of  breath.  There is a change in discomfort in your chest.  Your speech is slurred or you have trouble moving an arm or leg on one side of your body.  You get a serious muscle cramp that does not go away.  Your fingers or toes turn cold or blue. Document Released: 02/08/2013 Document Revised: 09/04/2013 Document Reviewed: 02/08/2013 [facility] Patient Information 2015 [place], [place]. This information is not intended to replace advice given to you by your health care provider. Make sure you discuss any questions you have with your health care provider.  [employer] Patient Information 2015 [place]. This information is not intended to replace advice given to you by your health care provider. Make sure you discuss any questions you have with your health care provider.

## 2014-09-06 NOTE — H&P (Signed)
HPI  [name] is a 81 y.o. male Seen in followup for ICD which was down graded to a pacemaker 2013. It was implanted for ischemic heart disease. He has a history of bypass surgery and underwent catheterization most recently 2006 demonstrating ejection fraction of 35-40% with patent LIMA and patent vein graft to his posterior descending.   He underwent device generator replacement with downgrade to dual chamber pacemaker and capping high-voltage 6949-lead  He is seen today as an add-on because remote interrogation demonstrated a new identification of atrial fibrillation/flutter. He is noted initially some shortness of breath that was treated by an increased dose of diuretics by his PCP. He is also noted sleepiness .   His wife of 74 yrs died 08-27-2022 of recurrent CA which was "100% cured" Its been very hard  BP at home 130-160   Past Medical History  Diagnosis Date  . Sinus node dysfunction   . Ischemic cardiomyopathy   . S/P implantation of automatic cardioverter/defibrillator (AICD)     For the above-Medtronic EnTrust  . Pacemaker     Nov. 2014  . Hypertension   . BPH (benign prostatic hyperplasia)   . Colon cancer     Partial resection    Past Surgical History  Procedure Laterality Date  . Cardiac defibrillator placement      Medtronic EnTrust  . Coronary artery bypass graft    . Colon cancer    . Implantable cardioverter defibrillator generator change N/A 03/24/2012    Procedure: IMPLANTABLE CARDIOVERTER DEFIBRILLATOR GENERATOR CHANGE; Surgeon: Deboraha Sprang, MD; Location: [facility] CATH LAB; Service: Cardiovascular; Laterality: N/A;    Current Outpatient Prescriptions  Medication Sig Dispense Refill  . benazepril (LOTENSIN) 10 MG tablet Take 10 mg by mouth daily.     . bethanechol (URECHOLINE) 50 MG tablet Take 1 tablet by mouth Twice daily.    Marland Kitchen eliquis 2.5 mg  Two times daily    .  EPITOL 200 MG tablet Take 100 mg by mouth Twice daily.     . finasteride (PROSCAR) 5 MG tablet     . furosemide (LASIX) 40 MG tablet 2 (two) times daily.    Marland Kitchen GLUCOSAMINE-CHONDROITIN PO Take by mouth 2 (two) times daily.    . metoprolol (LOPRESSOR) 50 MG tablet Take 25 mg by mouth 2 (two) times daily.     . Multiple Vitamins-Minerals (CENTRUM) tablet Take 1 tablet by mouth daily.     . Tamsulosin HCl (FLOMAX) 0.4 MG CAPS Take 1 tablet by mouth 2 (two) times daily.     . traMADol (ULTRAM) 50 MG tablet Take 50 mg by mouth every 8 (eight) hours as needed. Pain.    . vitamin B-12 (CYANOCOBALAMIN) 500 MCG tablet Take 500 mcg by mouth daily.      No current facility-administered medications for this visit.    Allergies  Allergen Reactions  . Carvedilol     REACTION: multiple myalgias  . Celecoxib     Review of Systems negative except from HPI and PMH  Physical Exam BP 138/88 mmHg  Pulse 87  Ht 5\' 10"  (1.778 m)  Wt 170 lb (77.111 kg)  BMI 24.39 kg/m2 Well developed and well nourished in no acute distress HENT normal E scleral and icterus clear Neck Supple JVP flat; carotids brisk and full Device pocket well healed; without hematoma or erythema. There is no tethering  Clear to ausculation Regular rate and rhythm, no murmurs gallops or rub Soft with active bowel sounds No clubbing cyanosis no  Edema Alert and oriented, Skin Warm and Dry  ECG atrial fib at 75 or so with intermittent V pacing  Assessment and Plan  Sinus node dysfunction  Atrial flutter/fibrillation  Patinet remains in Afib Has been on Eliquis now for about 30 days  Takes bid   Will check labs   Plan for cardioversion today.

## 2014-09-06 NOTE — Op Note (Signed)
Patient anesthetized by anesthesia. With pads in AP position patient cardioverted to SR with 200 J synchronized biphasic energy Pacer interrogated post procedure Patient instructed to increase Eliquis to 5 mg bid

## 2014-09-06 NOTE — Telephone Encounter (Signed)
Received phone call from Dr. Harrington Challenger who performed TEE/DCCV on patient today. Informed me that patient had been taking too low dose of Eliquis at 2.5 mg. Patient was instructed to increase this to 5mg  BID. I will send in new prescription.

## 2014-09-06 NOTE — Op Note (Signed)
LA, LA appendage without masses.    Full report to follow in CV section of chart

## 2014-09-06 NOTE — Transfer of Care (Signed)
Immediate Anesthesia Transfer of Care Note  Patient: [name]  Procedure(s) Performed: Procedure(s): CARDIOVERSION (N/A) TRANSESOPHAGEAL ECHOCARDIOGRAM (TEE) (N/A)  Patient Location: Endoscopy Unit  Anesthesia Type:MAC  Level of Consciousness: awake, alert  and oriented  Airway & Oxygen Therapy: Pt spontaneously breathing with patent airway, O2 via nasal cannula  Post-op Assessment: Report given to RN, Post -op Vital signs reviewed and stable and Patient moving all extremities X 4  Post vital signs: Reviewed and stable  Last Vitals:  Filed Vitals:   09/06/14 1345  BP:   Pulse:   Temp:   Resp: 17    Complications: No apparent anesthesia complications

## 2014-09-06 NOTE — Progress Notes (Signed)
Echocardiogram Echocardiogram Transesophageal has been performed.  Joelene Millin 09/06/2014, 1:58 PM

## 2014-09-07 ENCOUNTER — Encounter (HOSPITAL_COMMUNITY): Payer: Self-pay | Admitting: Internal Medicine

## 2014-09-13 DIAGNOSIS — M9904 Segmental and somatic dysfunction of sacral region: Secondary | ICD-10-CM | POA: Diagnosis not present

## 2014-09-13 DIAGNOSIS — M546 Pain in thoracic spine: Secondary | ICD-10-CM | POA: Diagnosis not present

## 2014-09-13 DIAGNOSIS — M9902 Segmental and somatic dysfunction of thoracic region: Secondary | ICD-10-CM | POA: Diagnosis not present

## 2014-09-13 DIAGNOSIS — M5136 Other intervertebral disc degeneration, lumbar region: Secondary | ICD-10-CM | POA: Diagnosis not present

## 2014-09-13 DIAGNOSIS — M9903 Segmental and somatic dysfunction of lumbar region: Secondary | ICD-10-CM | POA: Diagnosis not present

## 2014-09-13 DIAGNOSIS — M5134 Other intervertebral disc degeneration, thoracic region: Secondary | ICD-10-CM | POA: Diagnosis not present

## 2014-09-13 DIAGNOSIS — M5441 Lumbago with sciatica, right side: Secondary | ICD-10-CM | POA: Diagnosis not present

## 2014-09-19 DIAGNOSIS — M546 Pain in thoracic spine: Secondary | ICD-10-CM | POA: Diagnosis not present

## 2014-09-19 DIAGNOSIS — M9904 Segmental and somatic dysfunction of sacral region: Secondary | ICD-10-CM | POA: Diagnosis not present

## 2014-09-19 DIAGNOSIS — M5134 Other intervertebral disc degeneration, thoracic region: Secondary | ICD-10-CM | POA: Diagnosis not present

## 2014-09-19 DIAGNOSIS — M9903 Segmental and somatic dysfunction of lumbar region: Secondary | ICD-10-CM | POA: Diagnosis not present

## 2014-09-19 DIAGNOSIS — M5441 Lumbago with sciatica, right side: Secondary | ICD-10-CM | POA: Diagnosis not present

## 2014-09-19 DIAGNOSIS — M9902 Segmental and somatic dysfunction of thoracic region: Secondary | ICD-10-CM | POA: Diagnosis not present

## 2014-09-19 DIAGNOSIS — M5136 Other intervertebral disc degeneration, lumbar region: Secondary | ICD-10-CM | POA: Diagnosis not present

## 2014-09-21 DIAGNOSIS — G300 Alzheimer's disease with early onset: Secondary | ICD-10-CM | POA: Diagnosis not present

## 2014-09-21 DIAGNOSIS — I10 Essential (primary) hypertension: Secondary | ICD-10-CM | POA: Diagnosis not present

## 2014-09-21 DIAGNOSIS — I2510 Atherosclerotic heart disease of native coronary artery without angina pectoris: Secondary | ICD-10-CM | POA: Diagnosis not present

## 2014-09-21 DIAGNOSIS — R609 Edema, unspecified: Secondary | ICD-10-CM | POA: Diagnosis not present

## 2014-09-21 DIAGNOSIS — I4891 Unspecified atrial fibrillation: Secondary | ICD-10-CM | POA: Diagnosis not present

## 2014-09-21 DIAGNOSIS — D649 Anemia, unspecified: Secondary | ICD-10-CM | POA: Diagnosis not present

## 2014-09-21 DIAGNOSIS — N401 Enlarged prostate with lower urinary tract symptoms: Secondary | ICD-10-CM | POA: Diagnosis not present

## 2014-09-21 DIAGNOSIS — R531 Weakness: Secondary | ICD-10-CM | POA: Diagnosis not present

## 2014-09-26 DIAGNOSIS — R972 Elevated prostate specific antigen [PSA]: Secondary | ICD-10-CM | POA: Diagnosis not present

## 2014-09-26 DIAGNOSIS — R351 Nocturia: Secondary | ICD-10-CM | POA: Diagnosis not present

## 2014-09-26 DIAGNOSIS — N401 Enlarged prostate with lower urinary tract symptoms: Secondary | ICD-10-CM | POA: Diagnosis not present

## 2014-09-28 DIAGNOSIS — N401 Enlarged prostate with lower urinary tract symptoms: Secondary | ICD-10-CM | POA: Diagnosis not present

## 2014-09-28 DIAGNOSIS — I429 Cardiomyopathy, unspecified: Secondary | ICD-10-CM | POA: Diagnosis not present

## 2014-09-28 DIAGNOSIS — I2510 Atherosclerotic heart disease of native coronary artery without angina pectoris: Secondary | ICD-10-CM | POA: Diagnosis not present

## 2014-09-28 DIAGNOSIS — I10 Essential (primary) hypertension: Secondary | ICD-10-CM | POA: Diagnosis not present

## 2014-09-28 DIAGNOSIS — G500 Trigeminal neuralgia: Secondary | ICD-10-CM | POA: Diagnosis not present

## 2014-09-28 DIAGNOSIS — Z6824 Body mass index (BMI) 24.0-24.9, adult: Secondary | ICD-10-CM | POA: Diagnosis not present

## 2014-10-08 ENCOUNTER — Encounter: Payer: Self-pay | Admitting: Internal Medicine

## 2014-10-22 DIAGNOSIS — C189 Malignant neoplasm of colon, unspecified: Secondary | ICD-10-CM | POA: Diagnosis not present

## 2014-10-22 DIAGNOSIS — E785 Hyperlipidemia, unspecified: Secondary | ICD-10-CM | POA: Diagnosis not present

## 2014-10-22 DIAGNOSIS — I2510 Atherosclerotic heart disease of native coronary artery without angina pectoris: Secondary | ICD-10-CM | POA: Diagnosis not present

## 2014-10-22 DIAGNOSIS — N401 Enlarged prostate with lower urinary tract symptoms: Secondary | ICD-10-CM | POA: Diagnosis not present

## 2014-10-22 DIAGNOSIS — E119 Type 2 diabetes mellitus without complications: Secondary | ICD-10-CM | POA: Diagnosis not present

## 2014-10-22 DIAGNOSIS — I4891 Unspecified atrial fibrillation: Secondary | ICD-10-CM | POA: Diagnosis not present

## 2014-10-22 DIAGNOSIS — G500 Trigeminal neuralgia: Secondary | ICD-10-CM | POA: Diagnosis not present

## 2014-10-22 DIAGNOSIS — I429 Cardiomyopathy, unspecified: Secondary | ICD-10-CM | POA: Diagnosis not present

## 2014-10-25 ENCOUNTER — Ambulatory Visit (INDEPENDENT_AMBULATORY_CARE_PROVIDER_SITE_OTHER): Payer: Medicare Other | Admitting: *Deleted

## 2014-10-25 DIAGNOSIS — I495 Sick sinus syndrome: Secondary | ICD-10-CM | POA: Diagnosis not present

## 2014-10-25 NOTE — Progress Notes (Signed)
Remote pacemaker transmission.   

## 2014-10-31 LAB — CUP PACEART REMOTE DEVICE CHECK
Brady Statistic AP VP Percent: 4 %
Brady Statistic AP VS Percent: 96 %
Brady Statistic AS VP Percent: 0 %
Brady Statistic AS VS Percent: 0 %
Lead Channel Impedance Value: 465 Ohm
Lead Channel Impedance Value: 466 Ohm
Lead Channel Pacing Threshold Amplitude: 0.625 V
Lead Channel Pacing Threshold Pulse Width: 0.4 ms
Lead Channel Pacing Threshold Pulse Width: 0.4 ms
Lead Channel Setting Pacing Amplitude: 2.5 V
MDC IDC MSMT BATTERY IMPEDANCE: 159 Ohm
MDC IDC MSMT BATTERY REMAINING LONGEVITY: 123 mo
MDC IDC MSMT BATTERY VOLTAGE: 2.8 V
MDC IDC MSMT LEADCHNL RA PACING THRESHOLD AMPLITUDE: 0.375 V
MDC IDC MSMT LEADCHNL RV SENSING INTR AMPL: 5.6 mV
MDC IDC SESS DTM: 20160623130321
MDC IDC SET LEADCHNL RA PACING AMPLITUDE: 2 V
MDC IDC SET LEADCHNL RV PACING PULSEWIDTH: 0.4 ms
MDC IDC SET LEADCHNL RV SENSING SENSITIVITY: 2.8 mV

## 2014-11-09 DIAGNOSIS — R55 Syncope and collapse: Secondary | ICD-10-CM | POA: Diagnosis not present

## 2014-11-09 DIAGNOSIS — R531 Weakness: Secondary | ICD-10-CM | POA: Diagnosis not present

## 2014-11-09 DIAGNOSIS — R404 Transient alteration of awareness: Secondary | ICD-10-CM | POA: Diagnosis not present

## 2014-11-12 DIAGNOSIS — I2510 Atherosclerotic heart disease of native coronary artery without angina pectoris: Secondary | ICD-10-CM | POA: Diagnosis not present

## 2014-11-12 DIAGNOSIS — I429 Cardiomyopathy, unspecified: Secondary | ICD-10-CM | POA: Diagnosis not present

## 2014-11-12 DIAGNOSIS — I4891 Unspecified atrial fibrillation: Secondary | ICD-10-CM | POA: Diagnosis not present

## 2014-11-12 DIAGNOSIS — R55 Syncope and collapse: Secondary | ICD-10-CM | POA: Diagnosis not present

## 2014-11-12 DIAGNOSIS — C189 Malignant neoplasm of colon, unspecified: Secondary | ICD-10-CM | POA: Diagnosis not present

## 2014-11-12 DIAGNOSIS — E119 Type 2 diabetes mellitus without complications: Secondary | ICD-10-CM | POA: Diagnosis not present

## 2014-11-12 DIAGNOSIS — N401 Enlarged prostate with lower urinary tract symptoms: Secondary | ICD-10-CM | POA: Diagnosis not present

## 2014-11-12 DIAGNOSIS — E162 Hypoglycemia, unspecified: Secondary | ICD-10-CM | POA: Diagnosis not present

## 2014-11-13 ENCOUNTER — Telehealth: Payer: Self-pay | Admitting: *Deleted

## 2014-11-13 NOTE — Telephone Encounter (Signed)
Per pt, "number" told by PCP was 60/40. Pt passed out 3x. Pt states he passes after swimming in pool. I asked pt if he passes out immediately after exiting pool. He clarified by explaining after swimming he'll take a shower then get dressed. After getting dressed, he will pass out. He also states he no longer gets in hot tub due to concern that was a culprit.   Pt has an appt w/ [facility] on 11/26/14. He will discuss BP issue/meds there. Pt also aware he has an appt to see Dr. Caryl Comes 12/07/14. For now, pt will keep that appt. If BP meds are changed at [facility], pt will call to cancel appt w/ Dr. Caryl Comes.   Pt aware if he does cancel Aug appt w/ Dr. Caryl Comes, he will need to see him Dec/2016.

## 2014-11-16 DIAGNOSIS — I4891 Unspecified atrial fibrillation: Secondary | ICD-10-CM | POA: Diagnosis not present

## 2014-11-16 DIAGNOSIS — R55 Syncope and collapse: Secondary | ICD-10-CM | POA: Diagnosis not present

## 2014-11-16 DIAGNOSIS — G500 Trigeminal neuralgia: Secondary | ICD-10-CM | POA: Diagnosis not present

## 2014-11-16 DIAGNOSIS — E162 Hypoglycemia, unspecified: Secondary | ICD-10-CM | POA: Diagnosis not present

## 2014-11-16 DIAGNOSIS — C189 Malignant neoplasm of colon, unspecified: Secondary | ICD-10-CM | POA: Diagnosis not present

## 2014-11-16 DIAGNOSIS — I429 Cardiomyopathy, unspecified: Secondary | ICD-10-CM | POA: Diagnosis not present

## 2014-11-16 DIAGNOSIS — N289 Disorder of kidney and ureter, unspecified: Secondary | ICD-10-CM | POA: Diagnosis not present

## 2014-11-16 DIAGNOSIS — D649 Anemia, unspecified: Secondary | ICD-10-CM | POA: Diagnosis not present

## 2014-11-26 DIAGNOSIS — Z45010 Encounter for checking and testing of cardiac pacemaker pulse generator [battery]: Secondary | ICD-10-CM | POA: Diagnosis not present

## 2014-11-26 DIAGNOSIS — I495 Sick sinus syndrome: Secondary | ICD-10-CM | POA: Diagnosis not present

## 2014-11-26 DIAGNOSIS — I2510 Atherosclerotic heart disease of native coronary artery without angina pectoris: Secondary | ICD-10-CM | POA: Diagnosis not present

## 2014-11-26 DIAGNOSIS — R55 Syncope and collapse: Secondary | ICD-10-CM | POA: Diagnosis not present

## 2014-11-26 DIAGNOSIS — I10 Essential (primary) hypertension: Secondary | ICD-10-CM | POA: Diagnosis not present

## 2014-11-26 DIAGNOSIS — I429 Cardiomyopathy, unspecified: Secondary | ICD-10-CM | POA: Diagnosis not present

## 2014-11-30 DIAGNOSIS — D649 Anemia, unspecified: Secondary | ICD-10-CM | POA: Diagnosis not present

## 2014-11-30 DIAGNOSIS — I429 Cardiomyopathy, unspecified: Secondary | ICD-10-CM | POA: Diagnosis not present

## 2014-11-30 DIAGNOSIS — Z6824 Body mass index (BMI) 24.0-24.9, adult: Secondary | ICD-10-CM | POA: Diagnosis not present

## 2014-11-30 DIAGNOSIS — M1990 Unspecified osteoarthritis, unspecified site: Secondary | ICD-10-CM | POA: Diagnosis not present

## 2014-11-30 DIAGNOSIS — G500 Trigeminal neuralgia: Secondary | ICD-10-CM | POA: Diagnosis not present

## 2014-11-30 DIAGNOSIS — N401 Enlarged prostate with lower urinary tract symptoms: Secondary | ICD-10-CM | POA: Diagnosis not present

## 2014-11-30 DIAGNOSIS — N289 Disorder of kidney and ureter, unspecified: Secondary | ICD-10-CM | POA: Diagnosis not present

## 2014-11-30 DIAGNOSIS — I4891 Unspecified atrial fibrillation: Secondary | ICD-10-CM | POA: Diagnosis not present

## 2014-12-07 ENCOUNTER — Ambulatory Visit (INDEPENDENT_AMBULATORY_CARE_PROVIDER_SITE_OTHER): Payer: Medicare Other | Admitting: Internal Medicine

## 2014-12-07 ENCOUNTER — Encounter: Payer: Self-pay | Admitting: Internal Medicine

## 2014-12-07 VITALS — BP 124/70 | HR 76 | Ht 70.0 in | Wt 174.4 lb

## 2014-12-07 DIAGNOSIS — Z950 Presence of cardiac pacemaker: Secondary | ICD-10-CM | POA: Diagnosis not present

## 2014-12-07 DIAGNOSIS — Z45018 Encounter for adjustment and management of other part of cardiac pacemaker: Secondary | ICD-10-CM

## 2014-12-07 DIAGNOSIS — R55 Syncope and collapse: Secondary | ICD-10-CM | POA: Diagnosis not present

## 2014-12-07 DIAGNOSIS — I495 Sick sinus syndrome: Secondary | ICD-10-CM

## 2014-12-07 LAB — CUP PACEART INCLINIC DEVICE CHECK
Battery Remaining Longevity: 119 mo
Brady Statistic AP VP Percent: 3 %
Brady Statistic AP VS Percent: 96 %
Date Time Interrogation Session: 20160805105328
Lead Channel Pacing Threshold Pulse Width: 0.4 ms
Lead Channel Pacing Threshold Pulse Width: 0.4 ms
Lead Channel Sensing Intrinsic Amplitude: 2 mV
Lead Channel Sensing Intrinsic Amplitude: 5.6 mV
Lead Channel Setting Pacing Amplitude: 2 V
Lead Channel Setting Pacing Amplitude: 2.5 V
Lead Channel Setting Pacing Pulse Width: 0.4 ms
Lead Channel Setting Sensing Sensitivity: 2.8 mV
MDC IDC MSMT BATTERY IMPEDANCE: 183 Ohm
MDC IDC MSMT BATTERY VOLTAGE: 2.79 V
MDC IDC MSMT LEADCHNL RA IMPEDANCE VALUE: 479 Ohm
MDC IDC MSMT LEADCHNL RA PACING THRESHOLD AMPLITUDE: 0.5 V
MDC IDC MSMT LEADCHNL RV IMPEDANCE VALUE: 548 Ohm
MDC IDC MSMT LEADCHNL RV PACING THRESHOLD AMPLITUDE: 0.75 V
MDC IDC STAT BRADY AS VP PERCENT: 0 %
MDC IDC STAT BRADY AS VS PERCENT: 0 %

## 2014-12-07 NOTE — Patient Instructions (Signed)
Medication Instructions:  Your physician recommends that you continue on your current medications as directed. Please refer to the Current Medication list given to you today.  Labwork: None ordered  Testing/Procedures: None ordered  Follow-Up: Remote monitoring is used to monitor your Pacemaker of ICD from home. This monitoring reduces the number of office visits required to check your device to one time per year. It allows [place] to keep an eye on the functioning of your device to ensure it is working properly. You are scheduled for a device check from home on 03/11/15. You may send your transmission at any time that day. If you have a wireless device, the transmission will be sent automatically. After your physician reviews your transmission, you will receive a postcard with your next transmission date.  Your physician wants you to follow-up in: December with Dr. Gari Crown will receive a reminder letter in the mail two months in advance. If you don't receive a letter, please call our office to schedule the follow-up appointment.   Any Other Special Instructions Will Be Listed Below (If Applicable). Dr. Caryl Comes recommends that you start using an abdominal binder.  Thank you for choosing [facility]!!

## 2014-12-07 NOTE — Progress Notes (Signed)
Patient Care Team: Zorita Pang, MD as PCP - General (Unknown Physician Specialty)   HPI  [name] is a 81 y.o. male Seen in followup for ICD which was down graded to a pacemaker 2013. It was implanted for ischemic heart disease. He has a history of bypass surgery and underwent catheterization most recently 2006 demonstrating ejection fraction of 35-40% with patent LIMA and patent vein graft to his posterior descending.   He underwent device generator replacement with downgrade to dual chamber pacemaker and capping high-voltage 6949-lead   He was last seen in March after remote interrogation demonstrated atrial fibrillation. He underwent TEE guided cardioversion 5/16  this demonstrated no left atrial clot and moderate depression of LV systolic function   .  He called about a month ago. He was told his blood pressure was 60/40 and he had had recurrent syncope. He was to see [facility] and blood pressure medications were to be addressed.  These episodes have occurred while seated. He has had very little vertical dizziness and no shower intolerance. He has been better since his medications were adjusted with the discontinuation of his benazepril. He is also started eating chicken biscuits.   BP at home 130-160   His wife of 58 yrs died 09/04/2022 of recurrent CA which was "100% cured"  Its been very hard    Past Medical History  Diagnosis Date  . Sinus node dysfunction   . Ischemic cardiomyopathy   . S/P implantation of automatic cardioverter/defibrillator (AICD)     For the above-Medtronic EnTrust  . Pacemaker     Nov. 2014  . Hypertension   . BPH (benign prostatic hyperplasia)   . Colon cancer     Partial resection    Past Surgical History  Procedure Laterality Date  . Cardiac defibrillator placement      Medtronic EnTrust  . Coronary artery bypass graft    . Colon cancer    . Implantable cardioverter defibrillator generator change N/A 03/24/2012   Procedure: IMPLANTABLE CARDIOVERTER DEFIBRILLATOR GENERATOR CHANGE;  Surgeon: Deboraha Sprang, MD;  Location: [facility] CATH LAB;  Service: Cardiovascular;  Laterality: N/A;  . Cardioversion N/A 09/06/2014    Procedure: CARDIOVERSION;  Surgeon: Fay Records, MD;  Location: [place];  Service: Cardiovascular;  Laterality: N/A;  . Tee without cardioversion N/A 09/06/2014    Procedure: TRANSESOPHAGEAL ECHOCARDIOGRAM (TEE);  Surgeon: Fay Records, MD;  Location: [facility] ENDOSCOPY;  Service: Cardiovascular;  Laterality: N/A;    Current Outpatient Prescriptions  Medication Sig Dispense Refill  . apixaban (ELIQUIS) 5 MG TABS tablet Take 1 tablet (5 mg total) by mouth 2 (two) times daily. 60 tablet 6  . EPITOL 200 MG tablet Take 100 mg by mouth Twice daily.     . finasteride (PROSCAR) 5 MG tablet Take 5 mg by mouth daily.     . furosemide (LASIX) 40 MG tablet Take 40 mg by mouth 2 (two) times daily.     Marland Kitchen GLUCOSAMINE-CHONDROITIN PO Take 1 tablet by mouth 2 (two) times daily.     Marland Kitchen ibuprofen (ADVIL,MOTRIN) 200 MG tablet Take 200 mg by mouth every 6 (six) hours as needed.    . metFORMIN (GLUCOPHAGE) 500 MG tablet Take 500 mg by mouth daily.    . metoprolol (LOPRESSOR) 50 MG tablet Take 50 mg by mouth 2 (two) times daily.     . Multiple Vitamins-Minerals (CENTRUM) tablet Take 1 tablet by mouth daily.      . Tamsulosin  HCl (FLOMAX) 0.4 MG CAPS Take 1 tablet by mouth 2 (two) times daily.     . traMADol (ULTRAM) 50 MG tablet Take 50 mg by mouth every 8 (eight) hours as needed. Pain.    . vitamin B-12 (CYANOCOBALAMIN) 500 MCG tablet Take 500 mcg by mouth daily.       No current facility-administered medications for this visit.    Allergies  Allergen Reactions  . Carvedilol     REACTION: multiple myalgias  . Celecoxib     Celebrex  UNKNOWN     Review of Systems negative except from HPI and PMH  Physical Exam BP 124/70 mmHg  Pulse 76  Ht 5\' 10"  (1.778 m)  Wt 174 lb 6.4 oz (79.107 kg)  BMI 25.02  kg/m2 Well developed and well nourished in no acute distress HENT normal E scleral and icterus clear Neck Supple JVP flat; carotids brisk and full Device pocket well healed; without hematoma or erythema.  There is no tethering  Clear to ausculation  Regular rate and rhythm, no murmurs gallops or rub Soft with active bowel sounds No clubbing cyanosis no Edema Alert and oriented, Skin Warm and Dry  ECG  atrial pacing at 76 Intervals 42/12/38 Nonspecific ST changes  Assessment and  Plan  Sinus node dysfunction  Atrial flutter/fibrillation  1AVblock-profound  Orthostatic syncope  Pacemaker Medtronic  The patient's device was interrogated.  The information was reviewed. No changes were made in the programming.     Ischemic cardiomyopathy   Without symptoms of ischemia  Hypertension  Better controlled  He has had orthostatic syncope without lightheadedness or fainting. This makes it very difficult to identify therapeutic adequacy. Hence, we will use an abdominal binder so as to minimize the likelihood of drug interactions and side effects. He is further reminded that with his history of recurrent syncope he is restricted from driving for 3-6 months.  I've encouraged him to increase his salt and water intake modestly.

## 2014-12-25 DIAGNOSIS — M9903 Segmental and somatic dysfunction of lumbar region: Secondary | ICD-10-CM | POA: Diagnosis not present

## 2014-12-25 DIAGNOSIS — M5136 Other intervertebral disc degeneration, lumbar region: Secondary | ICD-10-CM | POA: Diagnosis not present

## 2014-12-25 DIAGNOSIS — M5441 Lumbago with sciatica, right side: Secondary | ICD-10-CM | POA: Diagnosis not present

## 2014-12-25 DIAGNOSIS — M9904 Segmental and somatic dysfunction of sacral region: Secondary | ICD-10-CM | POA: Diagnosis not present

## 2014-12-25 DIAGNOSIS — M5134 Other intervertebral disc degeneration, thoracic region: Secondary | ICD-10-CM | POA: Diagnosis not present

## 2014-12-25 DIAGNOSIS — M546 Pain in thoracic spine: Secondary | ICD-10-CM | POA: Diagnosis not present

## 2014-12-25 DIAGNOSIS — M9902 Segmental and somatic dysfunction of thoracic region: Secondary | ICD-10-CM | POA: Diagnosis not present

## 2014-12-27 DIAGNOSIS — M9903 Segmental and somatic dysfunction of lumbar region: Secondary | ICD-10-CM | POA: Diagnosis not present

## 2014-12-27 DIAGNOSIS — M5441 Lumbago with sciatica, right side: Secondary | ICD-10-CM | POA: Diagnosis not present

## 2014-12-27 DIAGNOSIS — M9904 Segmental and somatic dysfunction of sacral region: Secondary | ICD-10-CM | POA: Diagnosis not present

## 2014-12-27 DIAGNOSIS — M5134 Other intervertebral disc degeneration, thoracic region: Secondary | ICD-10-CM | POA: Diagnosis not present

## 2014-12-27 DIAGNOSIS — M9902 Segmental and somatic dysfunction of thoracic region: Secondary | ICD-10-CM | POA: Diagnosis not present

## 2014-12-27 DIAGNOSIS — M5136 Other intervertebral disc degeneration, lumbar region: Secondary | ICD-10-CM | POA: Diagnosis not present

## 2014-12-27 DIAGNOSIS — M546 Pain in thoracic spine: Secondary | ICD-10-CM | POA: Diagnosis not present

## 2014-12-31 DIAGNOSIS — N401 Enlarged prostate with lower urinary tract symptoms: Secondary | ICD-10-CM | POA: Diagnosis not present

## 2014-12-31 DIAGNOSIS — I4891 Unspecified atrial fibrillation: Secondary | ICD-10-CM | POA: Diagnosis not present

## 2014-12-31 DIAGNOSIS — S29019A Strain of muscle and tendon of unspecified wall of thorax, initial encounter: Secondary | ICD-10-CM | POA: Diagnosis not present

## 2014-12-31 DIAGNOSIS — I2510 Atherosclerotic heart disease of native coronary artery without angina pectoris: Secondary | ICD-10-CM | POA: Diagnosis not present

## 2014-12-31 DIAGNOSIS — E119 Type 2 diabetes mellitus without complications: Secondary | ICD-10-CM | POA: Diagnosis not present

## 2014-12-31 DIAGNOSIS — N289 Disorder of kidney and ureter, unspecified: Secondary | ICD-10-CM | POA: Diagnosis not present

## 2014-12-31 DIAGNOSIS — G500 Trigeminal neuralgia: Secondary | ICD-10-CM | POA: Diagnosis not present

## 2014-12-31 DIAGNOSIS — I429 Cardiomyopathy, unspecified: Secondary | ICD-10-CM | POA: Diagnosis not present

## 2014-12-31 DIAGNOSIS — R55 Syncope and collapse: Secondary | ICD-10-CM | POA: Diagnosis not present

## 2014-12-31 DIAGNOSIS — D649 Anemia, unspecified: Secondary | ICD-10-CM | POA: Diagnosis not present

## 2014-12-31 DIAGNOSIS — I10 Essential (primary) hypertension: Secondary | ICD-10-CM | POA: Diagnosis not present

## 2015-01-02 DIAGNOSIS — M5441 Lumbago with sciatica, right side: Secondary | ICD-10-CM | POA: Diagnosis not present

## 2015-01-02 DIAGNOSIS — M5134 Other intervertebral disc degeneration, thoracic region: Secondary | ICD-10-CM | POA: Diagnosis not present

## 2015-01-02 DIAGNOSIS — M5136 Other intervertebral disc degeneration, lumbar region: Secondary | ICD-10-CM | POA: Diagnosis not present

## 2015-01-02 DIAGNOSIS — M9903 Segmental and somatic dysfunction of lumbar region: Secondary | ICD-10-CM | POA: Diagnosis not present

## 2015-01-02 DIAGNOSIS — M9904 Segmental and somatic dysfunction of sacral region: Secondary | ICD-10-CM | POA: Diagnosis not present

## 2015-01-02 DIAGNOSIS — M546 Pain in thoracic spine: Secondary | ICD-10-CM | POA: Diagnosis not present

## 2015-01-02 DIAGNOSIS — M9902 Segmental and somatic dysfunction of thoracic region: Secondary | ICD-10-CM | POA: Diagnosis not present

## 2015-01-09 DIAGNOSIS — M9903 Segmental and somatic dysfunction of lumbar region: Secondary | ICD-10-CM | POA: Diagnosis not present

## 2015-01-09 DIAGNOSIS — M5136 Other intervertebral disc degeneration, lumbar region: Secondary | ICD-10-CM | POA: Diagnosis not present

## 2015-01-09 DIAGNOSIS — M9902 Segmental and somatic dysfunction of thoracic region: Secondary | ICD-10-CM | POA: Diagnosis not present

## 2015-01-09 DIAGNOSIS — M5441 Lumbago with sciatica, right side: Secondary | ICD-10-CM | POA: Diagnosis not present

## 2015-01-09 DIAGNOSIS — M9904 Segmental and somatic dysfunction of sacral region: Secondary | ICD-10-CM | POA: Diagnosis not present

## 2015-01-09 DIAGNOSIS — M546 Pain in thoracic spine: Secondary | ICD-10-CM | POA: Diagnosis not present

## 2015-01-09 DIAGNOSIS — M5134 Other intervertebral disc degeneration, thoracic region: Secondary | ICD-10-CM | POA: Diagnosis not present

## 2015-01-15 DIAGNOSIS — M5136 Other intervertebral disc degeneration, lumbar region: Secondary | ICD-10-CM | POA: Diagnosis not present

## 2015-01-15 DIAGNOSIS — M5134 Other intervertebral disc degeneration, thoracic region: Secondary | ICD-10-CM | POA: Diagnosis not present

## 2015-01-15 DIAGNOSIS — M9902 Segmental and somatic dysfunction of thoracic region: Secondary | ICD-10-CM | POA: Diagnosis not present

## 2015-01-15 DIAGNOSIS — M9903 Segmental and somatic dysfunction of lumbar region: Secondary | ICD-10-CM | POA: Diagnosis not present

## 2015-01-15 DIAGNOSIS — M546 Pain in thoracic spine: Secondary | ICD-10-CM | POA: Diagnosis not present

## 2015-01-15 DIAGNOSIS — M5441 Lumbago with sciatica, right side: Secondary | ICD-10-CM | POA: Diagnosis not present

## 2015-01-15 DIAGNOSIS — M9904 Segmental and somatic dysfunction of sacral region: Secondary | ICD-10-CM | POA: Diagnosis not present

## 2015-01-29 DIAGNOSIS — I429 Cardiomyopathy, unspecified: Secondary | ICD-10-CM | POA: Diagnosis not present

## 2015-01-29 DIAGNOSIS — Z6824 Body mass index (BMI) 24.0-24.9, adult: Secondary | ICD-10-CM | POA: Diagnosis not present

## 2015-01-29 DIAGNOSIS — E785 Hyperlipidemia, unspecified: Secondary | ICD-10-CM | POA: Diagnosis not present

## 2015-01-29 DIAGNOSIS — C189 Malignant neoplasm of colon, unspecified: Secondary | ICD-10-CM | POA: Diagnosis not present

## 2015-01-29 DIAGNOSIS — Z9181 History of falling: Secondary | ICD-10-CM | POA: Diagnosis not present

## 2015-01-29 DIAGNOSIS — G500 Trigeminal neuralgia: Secondary | ICD-10-CM | POA: Diagnosis not present

## 2015-01-29 DIAGNOSIS — N401 Enlarged prostate with lower urinary tract symptoms: Secondary | ICD-10-CM | POA: Diagnosis not present

## 2015-01-29 DIAGNOSIS — D649 Anemia, unspecified: Secondary | ICD-10-CM | POA: Diagnosis not present

## 2015-01-29 DIAGNOSIS — I10 Essential (primary) hypertension: Secondary | ICD-10-CM | POA: Diagnosis not present

## 2015-01-29 DIAGNOSIS — I4891 Unspecified atrial fibrillation: Secondary | ICD-10-CM | POA: Diagnosis not present

## 2015-01-29 DIAGNOSIS — N289 Disorder of kidney and ureter, unspecified: Secondary | ICD-10-CM | POA: Diagnosis not present

## 2015-01-29 DIAGNOSIS — E119 Type 2 diabetes mellitus without complications: Secondary | ICD-10-CM | POA: Diagnosis not present

## 2015-02-25 DIAGNOSIS — Z6825 Body mass index (BMI) 25.0-25.9, adult: Secondary | ICD-10-CM | POA: Diagnosis not present

## 2015-02-25 DIAGNOSIS — E038 Other specified hypothyroidism: Secondary | ICD-10-CM | POA: Diagnosis not present

## 2015-02-25 DIAGNOSIS — N401 Enlarged prostate with lower urinary tract symptoms: Secondary | ICD-10-CM | POA: Diagnosis not present

## 2015-02-25 DIAGNOSIS — G500 Trigeminal neuralgia: Secondary | ICD-10-CM | POA: Diagnosis not present

## 2015-02-25 DIAGNOSIS — I4891 Unspecified atrial fibrillation: Secondary | ICD-10-CM | POA: Diagnosis not present

## 2015-02-25 DIAGNOSIS — I10 Essential (primary) hypertension: Secondary | ICD-10-CM | POA: Diagnosis not present

## 2015-02-25 DIAGNOSIS — E119 Type 2 diabetes mellitus without complications: Secondary | ICD-10-CM | POA: Diagnosis not present

## 2015-02-25 DIAGNOSIS — M1990 Unspecified osteoarthritis, unspecified site: Secondary | ICD-10-CM | POA: Diagnosis not present

## 2015-03-04 DIAGNOSIS — Z23 Encounter for immunization: Secondary | ICD-10-CM | POA: Diagnosis not present

## 2015-03-11 ENCOUNTER — Ambulatory Visit (INDEPENDENT_AMBULATORY_CARE_PROVIDER_SITE_OTHER): Payer: Medicare Other | Admitting: *Deleted

## 2015-03-11 ENCOUNTER — Telehealth: Payer: Self-pay | Admitting: Cardiology

## 2015-03-11 DIAGNOSIS — I495 Sick sinus syndrome: Secondary | ICD-10-CM

## 2015-03-11 NOTE — Telephone Encounter (Signed)
LMOVM reminding pt to send remote transmission.   

## 2015-03-12 LAB — CUP PACEART REMOTE DEVICE CHECK
Battery Impedance: 207 Ohm
Battery Remaining Longevity: 115 mo
Brady Statistic AP VP Percent: 5 %
Brady Statistic AS VP Percent: 0 %
Date Time Interrogation Session: 20161107195802
Implantable Lead Implant Date: 20061227
Implantable Lead Location: 753859
Implantable Lead Model: 5076
Implantable Lead Model: 6949
Lead Channel Impedance Value: 487 Ohm
Lead Channel Pacing Threshold Amplitude: 0.375 V
Lead Channel Pacing Threshold Pulse Width: 0.4 ms
Lead Channel Setting Pacing Amplitude: 2.5 V
Lead Channel Setting Pacing Pulse Width: 0.4 ms
MDC IDC LEAD IMPLANT DT: 20061227
MDC IDC LEAD LOCATION: 753860
MDC IDC MSMT BATTERY VOLTAGE: 2.8 V
MDC IDC MSMT LEADCHNL RV IMPEDANCE VALUE: 550 Ohm
MDC IDC MSMT LEADCHNL RV PACING THRESHOLD AMPLITUDE: 0.75 V
MDC IDC MSMT LEADCHNL RV PACING THRESHOLD PULSEWIDTH: 0.4 ms
MDC IDC MSMT LEADCHNL RV SENSING INTR AMPL: 5.6 mV
MDC IDC SET LEADCHNL RA PACING AMPLITUDE: 2 V
MDC IDC SET LEADCHNL RV SENSING SENSITIVITY: 2.8 mV
MDC IDC STAT BRADY AP VS PERCENT: 95 %
MDC IDC STAT BRADY AS VS PERCENT: 0 %

## 2015-03-12 NOTE — Progress Notes (Signed)
Remote pacemaker transmission.   

## 2015-03-13 ENCOUNTER — Encounter: Payer: Self-pay | Admitting: Cardiology

## 2015-03-14 DIAGNOSIS — R404 Transient alteration of awareness: Secondary | ICD-10-CM | POA: Diagnosis not present

## 2015-03-14 DIAGNOSIS — R531 Weakness: Secondary | ICD-10-CM | POA: Diagnosis not present

## 2015-03-25 DIAGNOSIS — M9903 Segmental and somatic dysfunction of lumbar region: Secondary | ICD-10-CM | POA: Diagnosis not present

## 2015-03-25 DIAGNOSIS — M9902 Segmental and somatic dysfunction of thoracic region: Secondary | ICD-10-CM | POA: Diagnosis not present

## 2015-03-25 DIAGNOSIS — M9904 Segmental and somatic dysfunction of sacral region: Secondary | ICD-10-CM | POA: Diagnosis not present

## 2015-03-25 DIAGNOSIS — M5136 Other intervertebral disc degeneration, lumbar region: Secondary | ICD-10-CM | POA: Diagnosis not present

## 2015-03-25 DIAGNOSIS — M546 Pain in thoracic spine: Secondary | ICD-10-CM | POA: Diagnosis not present

## 2015-03-25 DIAGNOSIS — M5134 Other intervertebral disc degeneration, thoracic region: Secondary | ICD-10-CM | POA: Diagnosis not present

## 2015-03-25 DIAGNOSIS — M5441 Lumbago with sciatica, right side: Secondary | ICD-10-CM | POA: Diagnosis not present

## 2015-04-01 DIAGNOSIS — R972 Elevated prostate specific antigen [PSA]: Secondary | ICD-10-CM | POA: Diagnosis not present

## 2015-04-01 DIAGNOSIS — N401 Enlarged prostate with lower urinary tract symptoms: Secondary | ICD-10-CM | POA: Diagnosis not present

## 2015-04-01 DIAGNOSIS — R339 Retention of urine, unspecified: Secondary | ICD-10-CM | POA: Diagnosis not present

## 2015-04-19 ENCOUNTER — Ambulatory Visit (INDEPENDENT_AMBULATORY_CARE_PROVIDER_SITE_OTHER): Payer: Medicare Other | Admitting: Internal Medicine

## 2015-04-19 ENCOUNTER — Encounter: Payer: Self-pay | Admitting: Internal Medicine

## 2015-04-19 VITALS — BP 147/81 | HR 80 | Ht 70.0 in | Wt 174.4 lb

## 2015-04-19 DIAGNOSIS — Z45018 Encounter for adjustment and management of other part of cardiac pacemaker: Secondary | ICD-10-CM

## 2015-04-19 DIAGNOSIS — I481 Persistent atrial fibrillation: Secondary | ICD-10-CM | POA: Diagnosis not present

## 2015-04-19 DIAGNOSIS — I495 Sick sinus syndrome: Secondary | ICD-10-CM

## 2015-04-19 DIAGNOSIS — I4819 Other persistent atrial fibrillation: Secondary | ICD-10-CM

## 2015-04-19 DIAGNOSIS — Z950 Presence of cardiac pacemaker: Secondary | ICD-10-CM

## 2015-04-19 LAB — CUP PACEART INCLINIC DEVICE CHECK
Battery Impedance: 206 Ohm
Battery Remaining Longevity: 115 mo
Battery Voltage: 2.8 V
Brady Statistic AP VP Percent: 5 %
Brady Statistic AP VS Percent: 95 %
Brady Statistic AS VP Percent: 0 %
Date Time Interrogation Session: 20161216180655
Implantable Lead Implant Date: 20061227
Implantable Lead Location: 753859
Implantable Lead Location: 753860
Implantable Lead Model: 5076
Implantable Lead Model: 6949
Lead Channel Impedance Value: 493 Ohm
Lead Channel Pacing Threshold Amplitude: 0.5 V
Lead Channel Pacing Threshold Amplitude: 0.75 V
Lead Channel Pacing Threshold Pulse Width: 0.4 ms
Lead Channel Pacing Threshold Pulse Width: 0.4 ms
Lead Channel Sensing Intrinsic Amplitude: 4 mV
Lead Channel Sensing Intrinsic Amplitude: 8 mV
Lead Channel Setting Pacing Amplitude: 2 V
Lead Channel Setting Pacing Amplitude: 2.5 V
Lead Channel Setting Pacing Pulse Width: 0.4 ms
Lead Channel Setting Sensing Sensitivity: 2 mV
MDC IDC LEAD IMPLANT DT: 20061227
MDC IDC MSMT LEADCHNL RV IMPEDANCE VALUE: 502 Ohm
MDC IDC STAT BRADY AS VS PERCENT: 0 %

## 2015-04-19 NOTE — Patient Instructions (Signed)
Medication Instructions: - no changes  Labwork: - none  Procedures/Testing: - none  Follow-Up: - Your physician recommends that you schedule a follow-up appointment in: 3 months with Dr. Klein.  Any Additional Special Instructions Will Be Listed Below (If Applicable).   

## 2015-04-19 NOTE — Progress Notes (Signed)
Patient Care Team: Zorita Pang, MD as PCP - General (Unknown Physician Specialty)   HPI  [name] is a 81 y.o. male Seen in followup for ICD which was down graded to a pacemaker 2013. It was implanted for ischemic heart disease. He has a history of bypass surgery and underwent catheterization most recently 2006 demonstrating ejection fraction of 35-40% with patent LIMA and patent vein graft to his posterior descending.   He underwent device generator replacement with downgrade to dual chamber pacemaker and capping high-voltage 6949-lead  He was last seen in March after remote interrogation demonstrated atrial fibrillation. He underwent TEE guided cardioversion 5/16  this demonstrated no left atrial clot and moderate depression of LV systolic function    He continues with some post radial/. Prandial lightheadedness. He has had no intercurrent syncope. He denies lightheadedness with changes in position. He does not obtain an abdominal binder that we discussed at last visit.   His wife of 60 yrs died Aug 26, 2022 of recurrent CA which was "100% cured"  Its been very hard . He is here with his 2 daughters    Past Medical History  Diagnosis Date  . Sinus node dysfunction (HCC)   . Ischemic cardiomyopathy   . S/P implantation of automatic cardioverter/defibrillator (AICD)     For the above-Medtronic EnTrust  . Pacemaker     Nov. 2014  . Hypertension   . BPH (benign prostatic hyperplasia)   . Colon cancer [facility])     Partial resection    Past Surgical History  Procedure Laterality Date  . Cardiac defibrillator placement      Medtronic EnTrust  . Coronary artery bypass graft    . Colon cancer    . Implantable cardioverter defibrillator generator change N/A 03/24/2012    Procedure: IMPLANTABLE CARDIOVERTER DEFIBRILLATOR GENERATOR CHANGE;  Surgeon: Deboraha Sprang, MD;  Location: [facility] CATH LAB;  Service: Cardiovascular;  Laterality: N/A;  . Cardioversion N/A 09/06/2014    Procedure:  CARDIOVERSION;  Surgeon: Fay Records, MD;  Location: [place];  Service: Cardiovascular;  Laterality: N/A;  . Tee without cardioversion N/A 09/06/2014    Procedure: TRANSESOPHAGEAL ECHOCARDIOGRAM (TEE);  Surgeon: Fay Records, MD;  Location: [facility] ENDOSCOPY;  Service: Cardiovascular;  Laterality: N/A;    Current Outpatient Prescriptions  Medication Sig Dispense Refill  . apixaban (ELIQUIS) 5 MG TABS tablet Take 1 tablet (5 mg total) by mouth 2 (two) times daily. 60 tablet 6  . bethanechol (URECHOLINE) 50 MG tablet Take 25 mg by mouth daily. PT TAKES A HALF TABLET BY MOUTH ONCE DAILY    . EPITOL 200 MG tablet Take 100 mg by mouth Twice daily.     . finasteride (PROSCAR) 5 MG tablet Take 5 mg by mouth daily.     . furosemide (LASIX) 40 MG tablet Take 40 mg by mouth 2 (two) times daily.     Marland Kitchen GLUCOSAMINE-CHONDROITIN PO Take 1 tablet by mouth 2 (two) times daily.     Marland Kitchen ibuprofen (ADVIL,MOTRIN) 200 MG tablet Take 200 mg by mouth every 6 (six) hours as needed.    . metFORMIN (GLUCOPHAGE) 500 MG tablet Take 500 mg by mouth daily.    . metoprolol (LOPRESSOR) 50 MG tablet Take 50 mg by mouth 2 (two) times daily.     . Multiple Vitamins-Minerals (CENTRUM) tablet Take 1 tablet by mouth daily.      . Tamsulosin HCl (FLOMAX) 0.4 MG CAPS Take 1 tablet by mouth 2 (two)  times daily.     . traMADol (ULTRAM) 50 MG tablet Take 50 mg by mouth every 8 (eight) hours as needed. Pain.    . vitamin B-12 (CYANOCOBALAMIN) 500 MCG tablet Take 500 mcg by mouth daily.       No current facility-administered medications for this visit.    Allergies  Allergen Reactions  . Carvedilol     REACTION: multiple myalgias  . Celecoxib     Celebrex  UNKNOWN     Review of Systems negative except from HPI and PMH  Physical Exam BP 147/81 mmHg  Pulse 80  Ht 5\' 10"  (1.778 m)  Wt 174 lb 6.4 oz (79.107 kg)  BMI 25.02 kg/m2 Well developed and well nourished in no acute distress HENT normal E scleral and icterus  clear Neck Supple JVP flat; carotids brisk and full Device pocket well healed; without hematoma or erythema.  There is no tethering  Clear to ausculation  Regular rate and rhythm, no murmurs gallops or rub Soft with active bowel sounds No clubbing cyanosis no Edema Alert and oriented, Skin Warm and Dry  ECG  atrial pacing at 76 Intervals 46/12/38<<<42 Nonspecific ST changes  Assessment and  Plan  Sinus node dysfunction  Atrial flutter/fibrillation  1AVblock-profound  Postprandial lightheadedness  Pacemaker Medtronic  The patient's device was interrogated.  The information was reviewed. No changes were made in the programming.     Ischemic cardiomyopathy   Without symptoms of ischemia  Hypertension  Better controlled  Exercise intolerance  Lightheadedness   Postprandial is mostly related to heat fat content and volume of food. We have discussed this. Also the recommendation that he sit for a while after these events past. No arrhythmia has been noted with these events.  His worsening exercise intolerance could be a manifestation of ischemia, worsening of his conduction system disease, worsening LV function. We will plan to see him again in about 3-4 months and if that time think about LV function evaluation probably with a Myoview as it will give [place] both ischemic data as well as LV function data then activating the ventricular lead was a short AV delay albeit with some hesitation given his potential to aggravate left ventricular dysfunction

## 2015-04-30 DIAGNOSIS — I429 Cardiomyopathy, unspecified: Secondary | ICD-10-CM | POA: Diagnosis not present

## 2015-04-30 DIAGNOSIS — N401 Enlarged prostate with lower urinary tract symptoms: Secondary | ICD-10-CM | POA: Diagnosis not present

## 2015-04-30 DIAGNOSIS — I2510 Atherosclerotic heart disease of native coronary artery without angina pectoris: Secondary | ICD-10-CM | POA: Diagnosis not present

## 2015-04-30 DIAGNOSIS — I4891 Unspecified atrial fibrillation: Secondary | ICD-10-CM | POA: Diagnosis not present

## 2015-04-30 DIAGNOSIS — C189 Malignant neoplasm of colon, unspecified: Secondary | ICD-10-CM | POA: Diagnosis not present

## 2015-04-30 DIAGNOSIS — E119 Type 2 diabetes mellitus without complications: Secondary | ICD-10-CM | POA: Diagnosis not present

## 2015-04-30 DIAGNOSIS — G500 Trigeminal neuralgia: Secondary | ICD-10-CM | POA: Diagnosis not present

## 2015-04-30 DIAGNOSIS — E785 Hyperlipidemia, unspecified: Secondary | ICD-10-CM | POA: Diagnosis not present

## 2015-05-02 DIAGNOSIS — R55 Syncope and collapse: Secondary | ICD-10-CM | POA: Diagnosis not present

## 2015-05-02 DIAGNOSIS — I672 Cerebral atherosclerosis: Secondary | ICD-10-CM | POA: Diagnosis not present

## 2015-05-02 DIAGNOSIS — I6523 Occlusion and stenosis of bilateral carotid arteries: Secondary | ICD-10-CM | POA: Diagnosis not present

## 2015-06-06 DIAGNOSIS — H26493 Other secondary cataract, bilateral: Secondary | ICD-10-CM | POA: Diagnosis not present

## 2015-06-06 DIAGNOSIS — H353131 Nonexudative age-related macular degeneration, bilateral, early dry stage: Secondary | ICD-10-CM | POA: Diagnosis not present

## 2015-06-06 DIAGNOSIS — E119 Type 2 diabetes mellitus without complications: Secondary | ICD-10-CM | POA: Diagnosis not present

## 2015-06-06 DIAGNOSIS — H1851 Endothelial corneal dystrophy: Secondary | ICD-10-CM | POA: Diagnosis not present

## 2015-06-17 ENCOUNTER — Other Ambulatory Visit: Payer: Self-pay | Admitting: Internal Medicine

## 2015-07-01 DIAGNOSIS — M9903 Segmental and somatic dysfunction of lumbar region: Secondary | ICD-10-CM | POA: Diagnosis not present

## 2015-07-01 DIAGNOSIS — M9902 Segmental and somatic dysfunction of thoracic region: Secondary | ICD-10-CM | POA: Diagnosis not present

## 2015-07-01 DIAGNOSIS — M5136 Other intervertebral disc degeneration, lumbar region: Secondary | ICD-10-CM | POA: Diagnosis not present

## 2015-07-01 DIAGNOSIS — M9904 Segmental and somatic dysfunction of sacral region: Secondary | ICD-10-CM | POA: Diagnosis not present

## 2015-07-01 DIAGNOSIS — M5441 Lumbago with sciatica, right side: Secondary | ICD-10-CM | POA: Diagnosis not present

## 2015-07-01 DIAGNOSIS — M5134 Other intervertebral disc degeneration, thoracic region: Secondary | ICD-10-CM | POA: Diagnosis not present

## 2015-07-01 DIAGNOSIS — M546 Pain in thoracic spine: Secondary | ICD-10-CM | POA: Diagnosis not present

## 2015-07-02 DIAGNOSIS — N401 Enlarged prostate with lower urinary tract symptoms: Secondary | ICD-10-CM | POA: Diagnosis not present

## 2015-07-02 DIAGNOSIS — R972 Elevated prostate specific antigen [PSA]: Secondary | ICD-10-CM | POA: Diagnosis not present

## 2015-07-03 DIAGNOSIS — I429 Cardiomyopathy, unspecified: Secondary | ICD-10-CM | POA: Diagnosis not present

## 2015-07-03 DIAGNOSIS — I2510 Atherosclerotic heart disease of native coronary artery without angina pectoris: Secondary | ICD-10-CM | POA: Diagnosis not present

## 2015-07-03 DIAGNOSIS — Z6825 Body mass index (BMI) 25.0-25.9, adult: Secondary | ICD-10-CM | POA: Diagnosis not present

## 2015-07-03 DIAGNOSIS — I10 Essential (primary) hypertension: Secondary | ICD-10-CM | POA: Diagnosis not present

## 2015-07-18 ENCOUNTER — Encounter: Payer: Self-pay | Admitting: Internal Medicine

## 2015-07-18 ENCOUNTER — Ambulatory Visit (INDEPENDENT_AMBULATORY_CARE_PROVIDER_SITE_OTHER): Payer: Medicare Other | Admitting: Internal Medicine

## 2015-07-18 VITALS — BP 118/80 | HR 88 | Ht 70.0 in | Wt 178.2 lb

## 2015-07-18 DIAGNOSIS — Z45018 Encounter for adjustment and management of other part of cardiac pacemaker: Secondary | ICD-10-CM

## 2015-07-18 DIAGNOSIS — I495 Sick sinus syndrome: Secondary | ICD-10-CM

## 2015-07-18 NOTE — Progress Notes (Signed)
Patient Care Team: Zorita Pang, MD as PCP - General (Unknown Physician Specialty)   HPI  [name] is a 82 y.o. male Seen in followup for ICD which was down graded to a pacemaker 2013. It was implanted for ischemic heart disease. He has a history of bypass surgery and underwent catheterization most recently 2006 demonstrating ejection fraction of 35-40% with patent LIMA and patent vein graft to his posterior descending.   He underwent device generator replacement with downgrade to dual chamber pacemaker and capping high-voltage 6949-lead  He was last seen in March after remote interrogation demonstrated atrial fibrillation. He underwent TEE guided cardioversion 5/16  this demonstrated no left atrial clot and moderate depression of LV systolic function     He continues with some post prandial lightheadedness. He has had no intercurrent syncope. He denies lightheadedness with changes in position.  He is at the gym 4 days weeks and enjoing the the time with the ladies  He is gaining weight at the hands of his daughters   His wife of 60 yrs died 2022/09/14 of recurrent CA which was "100% cured"  Its been very hard . He is here with his 2 daughters    Past Medical History  Diagnosis Date  . Sinus node dysfunction (HCC)   . Ischemic cardiomyopathy   . S/P implantation of automatic cardioverter/defibrillator (AICD)     For the above-Medtronic EnTrust  . Pacemaker     Nov. 2014  . Hypertension   . BPH (benign prostatic hyperplasia)   . Colon cancer [facility])     Partial resection    Past Surgical History  Procedure Laterality Date  . Cardiac defibrillator placement      Medtronic EnTrust  . Coronary artery bypass graft    . Colon cancer    . Implantable cardioverter defibrillator generator change N/A 03/24/2012    Procedure: IMPLANTABLE CARDIOVERTER DEFIBRILLATOR GENERATOR CHANGE;  Surgeon: Deboraha Sprang, MD;  Location[facility] CATH LAB;  Service: Cardiovascular;  Laterality: N/A;   . Cardioversion N/A 09/06/2014    Procedure: CARDIOVERSION;  Surgeon: Fay Records, MD;  Location: [place];  Service: Cardiovascular;  Laterality: N/A;  . Tee without cardioversion N/A 09/06/2014    Procedure: TRANSESOPHAGEAL ECHOCARDIOGRAM (TEE);  Surgeon: Fay Records, MD;  Location: [facility] ENDOSCOPY;  Service: Cardiovascular;  Laterality: N/A;    Current Outpatient Prescriptions  Medication Sig Dispense Refill  . bethanechol (URECHOLINE) 50 MG tablet Take 25 mg by mouth daily. PT TAKES A HALF TABLET BY MOUTH ONCE DAILY    . ELIQUIS 5 MG TABS tablet TAKE ONE TABLET BY MOUTH TWICE DAILY... DOSE  INCREASE... 60 tablet 1  . EPITOL 200 MG tablet Take 100 mg by mouth Twice daily.     . finasteride (PROSCAR) 5 MG tablet Take 5 mg by mouth daily.     . furosemide (LASIX) 40 MG tablet Take 40 mg by mouth. Take one tablet (40mg ) every morning and one-half tablet (20mg ) every evening    . GLUCOSAMINE-CHONDROITIN PO Take 1 tablet by mouth 2 (two) times daily.     Marland Kitchen ibuprofen (ADVIL,MOTRIN) 200 MG tablet Take 200 mg by mouth every 6 (six) hours as needed.    . metFORMIN (GLUCOPHAGE) 500 MG tablet Take one-half tablet (250 mg) by mouth twice daily    . metoprolol (LOPRESSOR) 50 MG tablet Take 50 mg by mouth 2 (two) times daily.     . Multiple Vitamins-Minerals (CENTRUM) tablet Take 1 tablet  by mouth daily.      . Tamsulosin HCl (FLOMAX) 0.4 MG CAPS Take 1 tablet by mouth 2 (two) times daily.     . traMADol (ULTRAM) 50 MG tablet Take 50 mg by mouth every 8 (eight) hours as needed. Pain.    . vitamin B-12 (CYANOCOBALAMIN) 500 MCG tablet Take 500 mcg by mouth daily.       No current facility-administered medications for this visit.    Allergies  Allergen Reactions  . Carvedilol     REACTION: multiple myalgias  . Celecoxib     Celebrex  UNKNOWN     Review of Systems negative except from HPI and PMH  Physical Exam BP 118/80 mmHg  Pulse 88  Ht 5\' 10"  (1.778 m)  Wt 178 lb 3.2 oz (80.831 kg)   BMI 25.57 kg/m2 Well developed and well nourished in no acute distress HENT normal E scleral and icterus clear Neck Supple JVP flat; carotids brisk and full Device pocket well healed; without hematoma or erythema.  There is no tethering  Clear to ausculation  Regular rate and rhythm, no murmurs gallops or rub Soft with active bowel sounds No clubbing cyanosis no Edema Alert and oriented, Skin Warm and Dry  ECG  atrial pacing at 76 Intervals 50/12/38<<<42 Nonspecific ST changes  Assessment and  Plan  Sinus node dysfunction  Atrial flutter/fibrillation  1AVblock-profound worsening   Postprandial lightheadedness  Pacemaker Medtronic  The patient's device was interrogated.  The information was reviewed. No changes were made in the programming.     Ischemic cardiomyopathy   Without symptoms of ischemia  Hypertension  Better controlled  Exercise intolerance  Lightheadedness is improved; although with progressive 1AVB he is at risk for pacemaker syndrome  i am surprised that he is doing as much better as he is  His depression is lighter following more time since his wife passed  Exercise tolerance is better  Hence we will  Not do anything regarding AV synchrony

## 2015-07-18 NOTE — Patient Instructions (Addendum)
Medication Instructions:  Your physician recommends that you continue on your current medications as directed. Please refer to the Current Medication list given to you today.  If you need a refill on your cardiac medications before your next appointment, please call your pharmacy.  Labwork: None ordered  Testing/Procedures: None ordered  Follow-Up: Remote monitoring is used to monitor your Pacemaker of ICD from home. This monitoring reduces the number of office visits required to check your device to one time per year. It allows [place] to keep an eye on the functioning of your device to ensure it is working properly. You are scheduled for a device check from home on 10/17/2015. You may send your transmission at any time that day. If you have a wireless device, the transmission will be sent automatically. After your physician reviews your transmission, you will receive a postcard with your next transmission date.  Your physician wants you to follow-up in: 6 months with Dr. Caryl Comes. You will receive a reminder letter in the mail two months in advance. If you don't receive a letter, please call our office to schedule the follow-up appointment.  Thank you for choosing [facility]!!     Any Other Special Instructions Will Be Listed Below (If Applicable).

## 2015-07-24 DIAGNOSIS — M546 Pain in thoracic spine: Secondary | ICD-10-CM | POA: Diagnosis not present

## 2015-07-24 DIAGNOSIS — M5441 Lumbago with sciatica, right side: Secondary | ICD-10-CM | POA: Diagnosis not present

## 2015-07-24 DIAGNOSIS — M5136 Other intervertebral disc degeneration, lumbar region: Secondary | ICD-10-CM | POA: Diagnosis not present

## 2015-07-24 DIAGNOSIS — M9904 Segmental and somatic dysfunction of sacral region: Secondary | ICD-10-CM | POA: Diagnosis not present

## 2015-07-24 DIAGNOSIS — M5134 Other intervertebral disc degeneration, thoracic region: Secondary | ICD-10-CM | POA: Diagnosis not present

## 2015-07-24 DIAGNOSIS — M9902 Segmental and somatic dysfunction of thoracic region: Secondary | ICD-10-CM | POA: Diagnosis not present

## 2015-07-24 DIAGNOSIS — M9903 Segmental and somatic dysfunction of lumbar region: Secondary | ICD-10-CM | POA: Diagnosis not present

## 2015-07-24 LAB — CUP PACEART INCLINIC DEVICE CHECK
Battery Remaining Longevity: 111 mo
Battery Voltage: 2.8 V
Brady Statistic AP VS Percent: 93 %
Brady Statistic AS VS Percent: 0 %
Implantable Lead Implant Date: 20061227
Implantable Lead Location: 753859
Implantable Lead Model: 5076
Lead Channel Impedance Value: 480 Ohm
Lead Channel Impedance Value: 580 Ohm
Lead Channel Pacing Threshold Amplitude: 0.75 V
Lead Channel Pacing Threshold Pulse Width: 0.4 ms
Lead Channel Setting Pacing Amplitude: 2 V
Lead Channel Setting Pacing Amplitude: 2.5 V
Lead Channel Setting Pacing Pulse Width: 0.4 ms
MDC IDC LEAD IMPLANT DT: 20061227
MDC IDC LEAD LOCATION: 753860
MDC IDC LEAD MODEL: 6949
MDC IDC MSMT BATTERY IMPEDANCE: 231 Ohm
MDC IDC MSMT LEADCHNL RA PACING THRESHOLD AMPLITUDE: 0.25 V
MDC IDC MSMT LEADCHNL RA PACING THRESHOLD PULSEWIDTH: 0.4 ms
MDC IDC MSMT LEADCHNL RA SENSING INTR AMPL: 0.25 mV
MDC IDC MSMT LEADCHNL RV SENSING INTR AMPL: 8 mV
MDC IDC SESS DTM: 20170316191228
MDC IDC SET LEADCHNL RV SENSING SENSITIVITY: 2.8 mV
MDC IDC STAT BRADY AP VP PERCENT: 7 %
MDC IDC STAT BRADY AS VP PERCENT: 0 %

## 2015-07-29 DIAGNOSIS — G500 Trigeminal neuralgia: Secondary | ICD-10-CM | POA: Diagnosis not present

## 2015-07-29 DIAGNOSIS — N401 Enlarged prostate with lower urinary tract symptoms: Secondary | ICD-10-CM | POA: Diagnosis not present

## 2015-07-29 DIAGNOSIS — N289 Disorder of kidney and ureter, unspecified: Secondary | ICD-10-CM | POA: Diagnosis not present

## 2015-07-29 DIAGNOSIS — Z79899 Other long term (current) drug therapy: Secondary | ICD-10-CM | POA: Diagnosis not present

## 2015-07-29 DIAGNOSIS — I2510 Atherosclerotic heart disease of native coronary artery without angina pectoris: Secondary | ICD-10-CM | POA: Diagnosis not present

## 2015-07-29 DIAGNOSIS — R5383 Other fatigue: Secondary | ICD-10-CM | POA: Diagnosis not present

## 2015-07-29 DIAGNOSIS — E785 Hyperlipidemia, unspecified: Secondary | ICD-10-CM | POA: Diagnosis not present

## 2015-07-29 DIAGNOSIS — M818 Other osteoporosis without current pathological fracture: Secondary | ICD-10-CM | POA: Diagnosis not present

## 2015-07-29 DIAGNOSIS — E119 Type 2 diabetes mellitus without complications: Secondary | ICD-10-CM | POA: Diagnosis not present

## 2015-07-29 DIAGNOSIS — I429 Cardiomyopathy, unspecified: Secondary | ICD-10-CM | POA: Diagnosis not present

## 2015-08-08 ENCOUNTER — Other Ambulatory Visit: Payer: Self-pay | Admitting: Internal Medicine

## 2015-08-26 DIAGNOSIS — M25512 Pain in left shoulder: Secondary | ICD-10-CM | POA: Diagnosis not present

## 2015-08-26 DIAGNOSIS — E663 Overweight: Secondary | ICD-10-CM | POA: Diagnosis not present

## 2015-08-26 DIAGNOSIS — Z6825 Body mass index (BMI) 25.0-25.9, adult: Secondary | ICD-10-CM | POA: Diagnosis not present

## 2015-09-23 DIAGNOSIS — M9903 Segmental and somatic dysfunction of lumbar region: Secondary | ICD-10-CM | POA: Diagnosis not present

## 2015-09-23 DIAGNOSIS — M9902 Segmental and somatic dysfunction of thoracic region: Secondary | ICD-10-CM | POA: Diagnosis not present

## 2015-09-23 DIAGNOSIS — M5136 Other intervertebral disc degeneration, lumbar region: Secondary | ICD-10-CM | POA: Diagnosis not present

## 2015-09-23 DIAGNOSIS — M5441 Lumbago with sciatica, right side: Secondary | ICD-10-CM | POA: Diagnosis not present

## 2015-09-23 DIAGNOSIS — M5134 Other intervertebral disc degeneration, thoracic region: Secondary | ICD-10-CM | POA: Diagnosis not present

## 2015-09-23 DIAGNOSIS — M546 Pain in thoracic spine: Secondary | ICD-10-CM | POA: Diagnosis not present

## 2015-09-23 DIAGNOSIS — M9904 Segmental and somatic dysfunction of sacral region: Secondary | ICD-10-CM | POA: Diagnosis not present

## 2015-10-17 ENCOUNTER — Ambulatory Visit (INDEPENDENT_AMBULATORY_CARE_PROVIDER_SITE_OTHER): Payer: Medicare Other | Admitting: *Deleted

## 2015-10-17 DIAGNOSIS — I495 Sick sinus syndrome: Secondary | ICD-10-CM | POA: Diagnosis not present

## 2015-10-17 NOTE — Progress Notes (Signed)
Remote pacemaker transmission.   

## 2015-10-22 LAB — CUP PACEART REMOTE DEVICE CHECK
Battery Remaining Longevity: 110 mo
Battery Voltage: 2.8 V
Brady Statistic AS VP Percent: 0 %
Date Time Interrogation Session: 20170615124849
Implantable Lead Implant Date: 20061227
Implantable Lead Implant Date: 20061227
Implantable Lead Location: 753859
Implantable Lead Model: 5076
Implantable Lead Model: 6949
Lead Channel Pacing Threshold Amplitude: 0.375 V
Lead Channel Pacing Threshold Pulse Width: 0.4 ms
Lead Channel Pacing Threshold Pulse Width: 0.4 ms
Lead Channel Setting Pacing Amplitude: 2 V
Lead Channel Setting Pacing Amplitude: 2.5 V
Lead Channel Setting Pacing Pulse Width: 0.4 ms
Lead Channel Setting Sensing Sensitivity: 2.8 mV
MDC IDC LEAD LOCATION: 753860
MDC IDC MSMT BATTERY IMPEDANCE: 231 Ohm
MDC IDC MSMT LEADCHNL RA IMPEDANCE VALUE: 464 Ohm
MDC IDC MSMT LEADCHNL RV IMPEDANCE VALUE: 515 Ohm
MDC IDC MSMT LEADCHNL RV PACING THRESHOLD AMPLITUDE: 0.75 V
MDC IDC MSMT LEADCHNL RV SENSING INTR AMPL: 5.6 mV
MDC IDC STAT BRADY AP VP PERCENT: 8 %
MDC IDC STAT BRADY AP VS PERCENT: 92 %
MDC IDC STAT BRADY AS VS PERCENT: 0 %

## 2015-10-23 ENCOUNTER — Encounter: Payer: Self-pay | Admitting: Cardiology

## 2015-10-29 DIAGNOSIS — G500 Trigeminal neuralgia: Secondary | ICD-10-CM | POA: Diagnosis not present

## 2015-10-29 DIAGNOSIS — Z6824 Body mass index (BMI) 24.0-24.9, adult: Secondary | ICD-10-CM | POA: Diagnosis not present

## 2015-10-29 DIAGNOSIS — M1990 Unspecified osteoarthritis, unspecified site: Secondary | ICD-10-CM | POA: Diagnosis not present

## 2015-10-29 DIAGNOSIS — C189 Malignant neoplasm of colon, unspecified: Secondary | ICD-10-CM | POA: Diagnosis not present

## 2015-10-29 DIAGNOSIS — E119 Type 2 diabetes mellitus without complications: Secondary | ICD-10-CM | POA: Diagnosis not present

## 2015-10-29 DIAGNOSIS — Z1389 Encounter for screening for other disorder: Secondary | ICD-10-CM | POA: Diagnosis not present

## 2015-10-29 DIAGNOSIS — I10 Essential (primary) hypertension: Secondary | ICD-10-CM | POA: Diagnosis not present

## 2015-10-29 DIAGNOSIS — I2510 Atherosclerotic heart disease of native coronary artery without angina pectoris: Secondary | ICD-10-CM | POA: Diagnosis not present

## 2015-10-29 DIAGNOSIS — Z9181 History of falling: Secondary | ICD-10-CM | POA: Diagnosis not present

## 2015-10-29 DIAGNOSIS — E785 Hyperlipidemia, unspecified: Secondary | ICD-10-CM | POA: Diagnosis not present

## 2015-11-11 DIAGNOSIS — M9904 Segmental and somatic dysfunction of sacral region: Secondary | ICD-10-CM | POA: Diagnosis not present

## 2015-11-11 DIAGNOSIS — M9902 Segmental and somatic dysfunction of thoracic region: Secondary | ICD-10-CM | POA: Diagnosis not present

## 2015-11-11 DIAGNOSIS — M5134 Other intervertebral disc degeneration, thoracic region: Secondary | ICD-10-CM | POA: Diagnosis not present

## 2015-11-11 DIAGNOSIS — M546 Pain in thoracic spine: Secondary | ICD-10-CM | POA: Diagnosis not present

## 2015-11-11 DIAGNOSIS — M9903 Segmental and somatic dysfunction of lumbar region: Secondary | ICD-10-CM | POA: Diagnosis not present

## 2015-11-11 DIAGNOSIS — M5136 Other intervertebral disc degeneration, lumbar region: Secondary | ICD-10-CM | POA: Diagnosis not present

## 2015-11-11 DIAGNOSIS — M5441 Lumbago with sciatica, right side: Secondary | ICD-10-CM | POA: Diagnosis not present

## 2015-11-22 DIAGNOSIS — M7989 Other specified soft tissue disorders: Secondary | ICD-10-CM | POA: Diagnosis not present

## 2015-12-02 DIAGNOSIS — M9903 Segmental and somatic dysfunction of lumbar region: Secondary | ICD-10-CM | POA: Diagnosis not present

## 2015-12-02 DIAGNOSIS — M5136 Other intervertebral disc degeneration, lumbar region: Secondary | ICD-10-CM | POA: Diagnosis not present

## 2015-12-02 DIAGNOSIS — M9902 Segmental and somatic dysfunction of thoracic region: Secondary | ICD-10-CM | POA: Diagnosis not present

## 2015-12-02 DIAGNOSIS — M5441 Lumbago with sciatica, right side: Secondary | ICD-10-CM | POA: Diagnosis not present

## 2015-12-02 DIAGNOSIS — M5134 Other intervertebral disc degeneration, thoracic region: Secondary | ICD-10-CM | POA: Diagnosis not present

## 2015-12-02 DIAGNOSIS — M546 Pain in thoracic spine: Secondary | ICD-10-CM | POA: Diagnosis not present

## 2015-12-02 DIAGNOSIS — M9904 Segmental and somatic dysfunction of sacral region: Secondary | ICD-10-CM | POA: Diagnosis not present

## 2015-12-18 DIAGNOSIS — M5134 Other intervertebral disc degeneration, thoracic region: Secondary | ICD-10-CM | POA: Diagnosis not present

## 2015-12-18 DIAGNOSIS — M546 Pain in thoracic spine: Secondary | ICD-10-CM | POA: Diagnosis not present

## 2015-12-18 DIAGNOSIS — M9903 Segmental and somatic dysfunction of lumbar region: Secondary | ICD-10-CM | POA: Diagnosis not present

## 2015-12-18 DIAGNOSIS — M9904 Segmental and somatic dysfunction of sacral region: Secondary | ICD-10-CM | POA: Diagnosis not present

## 2015-12-18 DIAGNOSIS — M9902 Segmental and somatic dysfunction of thoracic region: Secondary | ICD-10-CM | POA: Diagnosis not present

## 2015-12-18 DIAGNOSIS — M5441 Lumbago with sciatica, right side: Secondary | ICD-10-CM | POA: Diagnosis not present

## 2015-12-18 DIAGNOSIS — M5136 Other intervertebral disc degeneration, lumbar region: Secondary | ICD-10-CM | POA: Diagnosis not present

## 2015-12-24 ENCOUNTER — Encounter: Payer: Self-pay | Admitting: Internal Medicine

## 2016-01-01 DIAGNOSIS — R339 Retention of urine, unspecified: Secondary | ICD-10-CM | POA: Diagnosis not present

## 2016-01-01 DIAGNOSIS — R972 Elevated prostate specific antigen [PSA]: Secondary | ICD-10-CM | POA: Diagnosis not present

## 2016-01-07 ENCOUNTER — Encounter: Payer: Self-pay | Admitting: Internal Medicine

## 2016-01-07 ENCOUNTER — Ambulatory Visit (INDEPENDENT_AMBULATORY_CARE_PROVIDER_SITE_OTHER): Payer: Medicare Other | Admitting: Internal Medicine

## 2016-01-07 VITALS — BP 140/78 | HR 97 | Ht 70.0 in | Wt 175.0 lb

## 2016-01-07 DIAGNOSIS — I255 Ischemic cardiomyopathy: Secondary | ICD-10-CM

## 2016-01-07 DIAGNOSIS — I495 Sick sinus syndrome: Secondary | ICD-10-CM | POA: Diagnosis not present

## 2016-01-07 DIAGNOSIS — I481 Persistent atrial fibrillation: Secondary | ICD-10-CM | POA: Diagnosis not present

## 2016-01-07 DIAGNOSIS — I4819 Other persistent atrial fibrillation: Secondary | ICD-10-CM

## 2016-01-07 DIAGNOSIS — Z950 Presence of cardiac pacemaker: Secondary | ICD-10-CM | POA: Diagnosis not present

## 2016-01-07 DIAGNOSIS — I4892 Unspecified atrial flutter: Secondary | ICD-10-CM

## 2016-01-07 NOTE — Patient Instructions (Signed)
Medication Instructions: - Your physician recommends that you continue on your current medications as directed. Please refer to the Current Medication list given to you today.  Labwork: - none  Procedures/Testing: - none  Follow-Up: - Remote monitoring is used to monitor your Pacemaker of ICD from home. This monitoring reduces the number of office visits required to check your device to one time per year. It allows [place] to keep an eye on the functioning of your device to ensure it is working properly. You are scheduled for a device check from home on 04/07/16. You may send your transmission at any time that day. If you have a wireless device, the transmission will be sent automatically. After your physician reviews your transmission, you will receive a postcard with your next transmission date.  - Your physician wants you to follow-up in: 1 year with Dr. Caryl Comes. You will receive a reminder letter in the mail two months in advance. If you don't receive a letter, please call our office to schedule the follow-up appointment.  Any Additional Special Instructions Will Be Listed Below (If Applicable).     If you need a refill on your cardiac medications before your next appointment, please call your pharmacy.

## 2016-01-07 NOTE — Progress Notes (Signed)
Patient Care Team: Ocie Doyne, MD as PCP - General (Unknown Physician Specialty)   HPI  [name] is a 82 y.o. male Seen in followup for ICD which was down graded to a pacemaker 2013. It was implanted for ischemic heart disease. He has a history of bypass surgery and underwent catheterization most recently 2006 demonstrating ejection fraction of 35-40% with patent LIMA and patent vein graft to his posterior descending.   He underwent device generator replacement with downgrade to dual chamber pacemaker and capping high-voltage 6949-lead  3/16 >> atrial fibrillation---TEE guided cardioversion 5/16     She is having no more problems with lightheadedness. He does have a little bit of peripheral edema.    No cp or sob   He is gaining weight at the hands of his daughters   His wife of 38 yrs died 2022-09-13 of recurrent CA which was "100% cured"  Its been very hard . He is here with his 2 daughters    Past Medical History:  Diagnosis Date  . BPH (benign prostatic hyperplasia)   . Colon cancer ([place])    Partial resection  . Hypertension   . Ischemic cardiomyopathy   . Pacemaker    Nov. 2014  . S/P implantation of automatic cardioverter/defibrillator (AICD)    For the above-Medtronic EnTrust  . Sinus node dysfunction [facility])     Past Surgical History:  Procedure Laterality Date  . CARDIAC DEFIBRILLATOR PLACEMENT     Medtronic EnTrust  . CARDIOVERSION N/A 09/06/2014   Procedure: CARDIOVERSION;  Surgeon: Fay Records, MD;  Location: [facility] ENDOSCOPY;  Service: Cardiovascular;  Laterality: N/A;  . colon cancer    . CORONARY ARTERY BYPASS GRAFT    . IMPLANTABLE CARDIOVERTER DEFIBRILLATOR GENERATOR CHANGE N/A 03/24/2012   Procedure: IMPLANTABLE CARDIOVERTER DEFIBRILLATOR GENERATOR CHANGE;  Surgeon: Deboraha Sprang, MD;  Location: [facility] CATH LAB;  Service: Cardiovascular;  Laterality: N/A;  . TEE WITHOUT CARDIOVERSION N/A 09/06/2014   Procedure: TRANSESOPHAGEAL ECHOCARDIOGRAM (TEE);   Surgeon: Fay Records, MD;  Location: [facility] ENDOSCOPY;  Service: Cardiovascular;  Laterality: N/A;    Current Outpatient Prescriptions  Medication Sig Dispense Refill  . bethanechol (URECHOLINE) 50 MG tablet Take 25 mg by mouth daily. PT TAKES A HALF TABLET BY MOUTH ONCE DAILY    . ELIQUIS 5 MG TABS tablet TAKE ONE TABLET BY MOUTH TWICE DAILY 60 tablet 10  . EPITOL 200 MG tablet Take 100 mg by mouth Twice daily.     . finasteride (PROSCAR) 5 MG tablet Take 5 mg by mouth daily.     . furosemide (LASIX) 40 MG tablet Take 40 mg by mouth. Take one tablet (40mg ) every morning and one-half tablet (20mg ) every evening    . GLUCOSAMINE-CHONDROITIN PO Take 1 tablet by mouth 2 (two) times daily.     Marland Kitchen ibuprofen (ADVIL,MOTRIN) 200 MG tablet Take 200 mg by mouth every 6 (six) hours as needed.    Marland Kitchen levothyroxine (SYNTHROID, LEVOTHROID) 25 MCG tablet Take 1 tablet by mouth every other day.    . metFORMIN (GLUCOPHAGE) 500 MG tablet Take one-half tablet (250 mg) by mouth twice daily    . metoprolol (LOPRESSOR) 50 MG tablet Take 50 mg by mouth 2 (two) times daily.     . Multiple Vitamins-Minerals (CENTRUM) tablet Take 1 tablet by mouth daily.      . Tamsulosin HCl (FLOMAX) 0.4 MG CAPS Take 1 tablet by mouth 2 (two) times daily.     Marland Kitchen  traMADol (ULTRAM) 50 MG tablet Take 50 mg by mouth every 8 (eight) hours as needed. Pain.    . vitamin B-12 (CYANOCOBALAMIN) 500 MCG tablet Take 500 mcg by mouth daily.       No current facility-administered medications for this visit.     Allergies  Allergen Reactions  . Carvedilol     REACTION: multiple myalgias  . Celecoxib     Celebrex  UNKNOWN     Review of Systems negative except from HPI and PMH  Physical Exam BP 140/78   Pulse 97   Ht 5\' 10"  (1.778 m)   Wt 175 lb (79.4 kg)   SpO2 97%   BMI 25.11 kg/m  Well developed and well nourished in no acute distress HENT normal E scleral and icterus clear Neck Supple JVP flat; carotids brisk and full Device  pocket well healed; without hematoma or erythema.  There is no tethering  Clear to ausculation  Regular rate and rhythm, no murmurs gallops or rub Soft with active bowel sounds No clubbing cyanosis no Edema Alert and oriented, Skin Warm and Dry  ECG  atrial pacing at 81 Intervals 22/17/38<<<42 Nonspecific ST changes  Assessment and  Plan  Sinus node dysfunction  Atrial flutter/fibrillation  1AVblock-profound worsening   Postprandial lightheadedness  Pacemaker Medtronic  The patient's device was interrogated.  The information was reviewed. No changes were made in the programming.     Ischemic cardiomyopathy   Without symptoms of ischemia  Hypertension  Better controlled  Exercise intolerance   Devices been reprogrammed to promote AV conduction. This is probably help mitigate his lightheadedness which was in part likely a consequence of "pacemaker syndrome" with prolonged first degree AV block  Blood pressure is stable.  No intercurrent atrial fibrillation or flutter  Given the edema in the context of orthostatic lightheadedness, we will not push his diuretics so as not to aggravate his orthostasis. He is quite content with this.

## 2016-01-08 LAB — CUP PACEART INCLINIC DEVICE CHECK
Battery Impedance: 255 Ohm
Battery Voltage: 2.8 V
Brady Statistic AP VS Percent: 93 %
Brady Statistic AS VP Percent: 0 %
Brady Statistic AS VS Percent: 0 %
Implantable Lead Implant Date: 20061227
Implantable Lead Location: 753859
Implantable Lead Location: 753860
Implantable Lead Model: 5076
Lead Channel Impedance Value: 465 Ohm
Lead Channel Impedance Value: 503 Ohm
Lead Channel Pacing Threshold Pulse Width: 0.4 ms
Lead Channel Pacing Threshold Pulse Width: 0.4 ms
Lead Channel Sensing Intrinsic Amplitude: 8 mV
Lead Channel Setting Pacing Amplitude: 2 V
Lead Channel Setting Pacing Pulse Width: 0.4 ms
Lead Channel Setting Sensing Sensitivity: 2 mV
MDC IDC LEAD IMPLANT DT: 20061227
MDC IDC LEAD MODEL: 6949
MDC IDC MSMT BATTERY REMAINING LONGEVITY: 107 mo
MDC IDC MSMT LEADCHNL RA PACING THRESHOLD AMPLITUDE: 0.5 V
MDC IDC MSMT LEADCHNL RA SENSING INTR AMPL: 1.4 mV
MDC IDC MSMT LEADCHNL RV PACING THRESHOLD AMPLITUDE: 0.75 V
MDC IDC SESS DTM: 20170905182702
MDC IDC SET LEADCHNL RV PACING AMPLITUDE: 2.5 V
MDC IDC STAT BRADY AP VP PERCENT: 7 %

## 2016-01-15 DIAGNOSIS — I10 Essential (primary) hypertension: Secondary | ICD-10-CM | POA: Diagnosis not present

## 2016-01-15 DIAGNOSIS — I429 Cardiomyopathy, unspecified: Secondary | ICD-10-CM | POA: Diagnosis not present

## 2016-01-15 DIAGNOSIS — I2510 Atherosclerotic heart disease of native coronary artery without angina pectoris: Secondary | ICD-10-CM | POA: Diagnosis not present

## 2016-01-15 DIAGNOSIS — Z6825 Body mass index (BMI) 25.0-25.9, adult: Secondary | ICD-10-CM | POA: Diagnosis not present

## 2016-01-20 DIAGNOSIS — M5441 Lumbago with sciatica, right side: Secondary | ICD-10-CM | POA: Diagnosis not present

## 2016-01-20 DIAGNOSIS — M5134 Other intervertebral disc degeneration, thoracic region: Secondary | ICD-10-CM | POA: Diagnosis not present

## 2016-01-20 DIAGNOSIS — M9904 Segmental and somatic dysfunction of sacral region: Secondary | ICD-10-CM | POA: Diagnosis not present

## 2016-01-20 DIAGNOSIS — M5136 Other intervertebral disc degeneration, lumbar region: Secondary | ICD-10-CM | POA: Diagnosis not present

## 2016-01-20 DIAGNOSIS — M9903 Segmental and somatic dysfunction of lumbar region: Secondary | ICD-10-CM | POA: Diagnosis not present

## 2016-01-20 DIAGNOSIS — M546 Pain in thoracic spine: Secondary | ICD-10-CM | POA: Diagnosis not present

## 2016-01-20 DIAGNOSIS — M9902 Segmental and somatic dysfunction of thoracic region: Secondary | ICD-10-CM | POA: Diagnosis not present

## 2016-01-28 DIAGNOSIS — I2510 Atherosclerotic heart disease of native coronary artery without angina pectoris: Secondary | ICD-10-CM | POA: Diagnosis not present

## 2016-01-28 DIAGNOSIS — I429 Cardiomyopathy, unspecified: Secondary | ICD-10-CM | POA: Diagnosis not present

## 2016-01-28 DIAGNOSIS — I10 Essential (primary) hypertension: Secondary | ICD-10-CM | POA: Diagnosis not present

## 2016-01-30 DIAGNOSIS — E785 Hyperlipidemia, unspecified: Secondary | ICD-10-CM | POA: Diagnosis not present

## 2016-01-30 DIAGNOSIS — E119 Type 2 diabetes mellitus without complications: Secondary | ICD-10-CM | POA: Diagnosis not present

## 2016-01-30 DIAGNOSIS — E038 Other specified hypothyroidism: Secondary | ICD-10-CM | POA: Diagnosis not present

## 2016-01-30 DIAGNOSIS — Z6824 Body mass index (BMI) 24.0-24.9, adult: Secondary | ICD-10-CM | POA: Diagnosis not present

## 2016-01-30 DIAGNOSIS — G500 Trigeminal neuralgia: Secondary | ICD-10-CM | POA: Diagnosis not present

## 2016-01-30 DIAGNOSIS — I2510 Atherosclerotic heart disease of native coronary artery without angina pectoris: Secondary | ICD-10-CM | POA: Diagnosis not present

## 2016-01-30 DIAGNOSIS — M1990 Unspecified osteoarthritis, unspecified site: Secondary | ICD-10-CM | POA: Diagnosis not present

## 2016-01-30 DIAGNOSIS — I10 Essential (primary) hypertension: Secondary | ICD-10-CM | POA: Diagnosis not present

## 2016-01-30 DIAGNOSIS — C189 Malignant neoplasm of colon, unspecified: Secondary | ICD-10-CM | POA: Diagnosis not present

## 2016-02-10 DIAGNOSIS — M5134 Other intervertebral disc degeneration, thoracic region: Secondary | ICD-10-CM | POA: Diagnosis not present

## 2016-02-10 DIAGNOSIS — M5136 Other intervertebral disc degeneration, lumbar region: Secondary | ICD-10-CM | POA: Diagnosis not present

## 2016-02-10 DIAGNOSIS — M9904 Segmental and somatic dysfunction of sacral region: Secondary | ICD-10-CM | POA: Diagnosis not present

## 2016-02-10 DIAGNOSIS — M546 Pain in thoracic spine: Secondary | ICD-10-CM | POA: Diagnosis not present

## 2016-02-10 DIAGNOSIS — M5441 Lumbago with sciatica, right side: Secondary | ICD-10-CM | POA: Diagnosis not present

## 2016-02-10 DIAGNOSIS — M9903 Segmental and somatic dysfunction of lumbar region: Secondary | ICD-10-CM | POA: Diagnosis not present

## 2016-02-10 DIAGNOSIS — M9902 Segmental and somatic dysfunction of thoracic region: Secondary | ICD-10-CM | POA: Diagnosis not present

## 2016-03-04 DIAGNOSIS — Z23 Encounter for immunization: Secondary | ICD-10-CM | POA: Diagnosis not present

## 2016-03-09 DIAGNOSIS — M5441 Lumbago with sciatica, right side: Secondary | ICD-10-CM | POA: Diagnosis not present

## 2016-03-09 DIAGNOSIS — M9902 Segmental and somatic dysfunction of thoracic region: Secondary | ICD-10-CM | POA: Diagnosis not present

## 2016-03-09 DIAGNOSIS — M5136 Other intervertebral disc degeneration, lumbar region: Secondary | ICD-10-CM | POA: Diagnosis not present

## 2016-03-09 DIAGNOSIS — M9903 Segmental and somatic dysfunction of lumbar region: Secondary | ICD-10-CM | POA: Diagnosis not present

## 2016-03-09 DIAGNOSIS — M5134 Other intervertebral disc degeneration, thoracic region: Secondary | ICD-10-CM | POA: Diagnosis not present

## 2016-03-09 DIAGNOSIS — M9904 Segmental and somatic dysfunction of sacral region: Secondary | ICD-10-CM | POA: Diagnosis not present

## 2016-03-09 DIAGNOSIS — M546 Pain in thoracic spine: Secondary | ICD-10-CM | POA: Diagnosis not present

## 2016-03-23 DIAGNOSIS — M9904 Segmental and somatic dysfunction of sacral region: Secondary | ICD-10-CM | POA: Diagnosis not present

## 2016-03-23 DIAGNOSIS — M9903 Segmental and somatic dysfunction of lumbar region: Secondary | ICD-10-CM | POA: Diagnosis not present

## 2016-03-23 DIAGNOSIS — M546 Pain in thoracic spine: Secondary | ICD-10-CM | POA: Diagnosis not present

## 2016-03-23 DIAGNOSIS — M5136 Other intervertebral disc degeneration, lumbar region: Secondary | ICD-10-CM | POA: Diagnosis not present

## 2016-03-23 DIAGNOSIS — M9902 Segmental and somatic dysfunction of thoracic region: Secondary | ICD-10-CM | POA: Diagnosis not present

## 2016-03-23 DIAGNOSIS — M5441 Lumbago with sciatica, right side: Secondary | ICD-10-CM | POA: Diagnosis not present

## 2016-03-23 DIAGNOSIS — M5134 Other intervertebral disc degeneration, thoracic region: Secondary | ICD-10-CM | POA: Diagnosis not present

## 2016-04-06 DIAGNOSIS — L03116 Cellulitis of left lower limb: Secondary | ICD-10-CM | POA: Diagnosis not present

## 2016-04-07 ENCOUNTER — Encounter: Payer: Medicare Other | Admitting: *Deleted

## 2016-04-07 ENCOUNTER — Telehealth: Payer: Self-pay | Admitting: Cardiology

## 2016-04-07 NOTE — Telephone Encounter (Signed)
Spoke with pt and reminded pt of remote transmission that is due today. Pt verbalized understanding.   

## 2016-04-16 DIAGNOSIS — L03116 Cellulitis of left lower limb: Secondary | ICD-10-CM | POA: Diagnosis not present

## 2016-04-16 DIAGNOSIS — M79605 Pain in left leg: Secondary | ICD-10-CM | POA: Diagnosis not present

## 2016-04-17 ENCOUNTER — Encounter: Payer: Self-pay | Admitting: Cardiology

## 2016-04-17 DIAGNOSIS — M79662 Pain in left lower leg: Secondary | ICD-10-CM | POA: Diagnosis not present

## 2016-04-17 DIAGNOSIS — S8992XA Unspecified injury of left lower leg, initial encounter: Secondary | ICD-10-CM | POA: Diagnosis not present

## 2016-04-17 DIAGNOSIS — M79605 Pain in left leg: Secondary | ICD-10-CM | POA: Diagnosis not present

## 2016-04-20 DIAGNOSIS — S83002A Unspecified subluxation of left patella, initial encounter: Secondary | ICD-10-CM | POA: Diagnosis not present

## 2016-04-20 DIAGNOSIS — S83102A Unspecified subluxation of left knee, initial encounter: Secondary | ICD-10-CM | POA: Diagnosis not present

## 2016-04-20 DIAGNOSIS — M25462 Effusion, left knee: Secondary | ICD-10-CM | POA: Diagnosis not present

## 2016-04-23 ENCOUNTER — Ambulatory Visit (INDEPENDENT_AMBULATORY_CARE_PROVIDER_SITE_OTHER): Payer: Medicare Other | Admitting: *Deleted

## 2016-04-23 DIAGNOSIS — I495 Sick sinus syndrome: Secondary | ICD-10-CM

## 2016-04-23 DIAGNOSIS — M1712 Unilateral primary osteoarthritis, left knee: Secondary | ICD-10-CM | POA: Diagnosis not present

## 2016-04-23 NOTE — Progress Notes (Signed)
Remote pacemaker transmission.   

## 2016-04-24 LAB — CUP PACEART REMOTE DEVICE CHECK
Battery Impedance: 279 Ohm
Brady Statistic AP VP Percent: 7 %
Brady Statistic AP VS Percent: 93 %
Brady Statistic AS VP Percent: 0 %
Brady Statistic AS VS Percent: 0 %
Date Time Interrogation Session: 20171220221534
Implantable Lead Implant Date: 20061227
Implantable Lead Location: 753859
Implantable Lead Model: 5076
Lead Channel Impedance Value: 465 Ohm
Lead Channel Impedance Value: 513 Ohm
Lead Channel Pacing Threshold Pulse Width: 0.4 ms
Lead Channel Pacing Threshold Pulse Width: 0.4 ms
Lead Channel Setting Pacing Amplitude: 2.5 V
MDC IDC LEAD IMPLANT DT: 20061227
MDC IDC LEAD LOCATION: 753860
MDC IDC LEAD MODEL: 6949
MDC IDC MSMT BATTERY REMAINING LONGEVITY: 104 mo
MDC IDC MSMT BATTERY VOLTAGE: 2.8 V
MDC IDC MSMT LEADCHNL RA PACING THRESHOLD AMPLITUDE: 0.375 V
MDC IDC MSMT LEADCHNL RV PACING THRESHOLD AMPLITUDE: 0.875 V
MDC IDC PG IMPLANT DT: 20131121
MDC IDC SET LEADCHNL RA PACING AMPLITUDE: 2 V
MDC IDC SET LEADCHNL RV PACING PULSEWIDTH: 0.4 ms
MDC IDC SET LEADCHNL RV SENSING SENSITIVITY: 2.8 mV

## 2016-04-28 DIAGNOSIS — M25571 Pain in right ankle and joints of right foot: Secondary | ICD-10-CM | POA: Diagnosis not present

## 2016-04-28 DIAGNOSIS — M1611 Unilateral primary osteoarthritis, right hip: Secondary | ICD-10-CM | POA: Diagnosis not present

## 2016-06-12 DIAGNOSIS — E119 Type 2 diabetes mellitus without complications: Secondary | ICD-10-CM | POA: Diagnosis not present

## 2016-06-12 DIAGNOSIS — H1851 Endothelial corneal dystrophy: Secondary | ICD-10-CM | POA: Diagnosis not present

## 2016-06-12 DIAGNOSIS — Z7984 Long term (current) use of oral hypoglycemic drugs: Secondary | ICD-10-CM | POA: Diagnosis not present

## 2016-06-12 DIAGNOSIS — H353131 Nonexudative age-related macular degeneration, bilateral, early dry stage: Secondary | ICD-10-CM | POA: Diagnosis not present

## 2016-06-24 DIAGNOSIS — E119 Type 2 diabetes mellitus without complications: Secondary | ICD-10-CM | POA: Diagnosis not present

## 2016-06-24 DIAGNOSIS — M71571 Other bursitis, not elsewhere classified, right ankle and foot: Secondary | ICD-10-CM | POA: Diagnosis not present

## 2016-06-24 DIAGNOSIS — M25871 Other specified joint disorders, right ankle and foot: Secondary | ICD-10-CM | POA: Diagnosis not present

## 2016-07-14 ENCOUNTER — Other Ambulatory Visit: Payer: Self-pay | Admitting: Internal Medicine

## 2016-07-15 DIAGNOSIS — I255 Ischemic cardiomyopathy: Secondary | ICD-10-CM | POA: Diagnosis not present

## 2016-07-15 DIAGNOSIS — I10 Essential (primary) hypertension: Secondary | ICD-10-CM | POA: Diagnosis not present

## 2016-07-15 DIAGNOSIS — I2510 Atherosclerotic heart disease of native coronary artery without angina pectoris: Secondary | ICD-10-CM | POA: Diagnosis not present

## 2016-07-15 DIAGNOSIS — Z6826 Body mass index (BMI) 26.0-26.9, adult: Secondary | ICD-10-CM | POA: Diagnosis not present

## 2016-07-15 NOTE — Telephone Encounter (Signed)
Attempted to contact pt, LMOM TCB.  Need to schedule pt for BMP and CBC in office prior to refilling Eliquis rx.  Last labs in chart are from 09/06/14.  Unsure if pt has primary MD that may be able to provide recent labs.

## 2016-07-15 NOTE — Telephone Encounter (Signed)
Pt last saw Dr Caryl Comes 01/07/16, last labwork in chart is from 09/06/14 no recent BMP or CBC within last year, age 83, wt 79.4kg.  Pt is currently on Eliquis 5mg  BID, pt needs labwork or if has primary care MD maybe has had more recent labs drawn.

## 2016-07-16 ENCOUNTER — Telehealth: Payer: Self-pay | Admitting: Pharmacist

## 2016-07-16 NOTE — Telephone Encounter (Signed)
Age 83 Wt 79.9kg (01/15/2016)  Seen by Dr Caryl Comes on (01/07/2016)  Labs done by Dr [facility]  On 01/30/2016 SrCr 1.09   Hgb 11.1 HCT 32.4

## 2016-07-16 NOTE — Telephone Encounter (Signed)
Received refill request for Eliquis. Pt found to be on Eliquis and Epitol (carbamazepine). Carbamazepine is a strong Cyp inducer and is listed as a category X interaction with Eliquis as this induction likely means that the levels of Eliquis are subtherapeutic. This interaction is thought to be less with Pradaxa (category C interaction) as Pradaxa is primarily processed by Pgp.   Will route to MD to advise if change to Pradaxa or warfarin would be most appropriate as would not recommend he remain on Eliquis given interaction.

## 2016-07-22 ENCOUNTER — Other Ambulatory Visit: Payer: Self-pay | Admitting: *Deleted

## 2016-07-22 DIAGNOSIS — M25871 Other specified joint disorders, right ankle and foot: Secondary | ICD-10-CM | POA: Diagnosis not present

## 2016-07-22 DIAGNOSIS — M71571 Other bursitis, not elsewhere classified, right ankle and foot: Secondary | ICD-10-CM | POA: Diagnosis not present

## 2016-07-22 NOTE — Telephone Encounter (Signed)
Pt last seen OV on 01/07/16 with Dr Caryl Comes. Last labs 01/30/16 at PCP, SCr 1.09. Wt @ OV 79.4Kg. Eliquis 5mg  BID is correct dose. After review of pts medication list he has Epitol on his med list and Erasmo Downer D has already routed this to pts MD for review.

## 2016-07-23 ENCOUNTER — Telehealth: Payer: Self-pay | Admitting: *Deleted

## 2016-07-23 ENCOUNTER — Encounter: Payer: Medicare Other | Admitting: *Deleted

## 2016-07-23 ENCOUNTER — Telehealth: Payer: Self-pay | Admitting: Cardiology

## 2016-07-23 MED ORDER — APIXABAN 5 MG PO TABS: 5.0000 mg | ORAL_TABLET | Freq: Two times a day (BID) | ORAL | 0 refills | Status: DC

## 2016-07-23 NOTE — Telephone Encounter (Signed)
Spoke with pt and reminded pt of remote transmission that is due today. Pt verbalized understanding.   

## 2016-07-23 NOTE — Telephone Encounter (Signed)
Refer to Tana Coast note regarding changing patient from eliquis to either pradaxa or coumadin due to some medication interactions, she has sent Dr Caryl Comes a message. Patient needs a refill on eliquis, has only 1 pill left. Per orders from Tana Coast the pharmacist since she hasnt heard anything back from Dr Caryl Comes send a  refill for 1 month supply of eliquis to pharmacy.

## 2016-07-23 NOTE — Telephone Encounter (Signed)
Patient called for refill on eliquis, has one pill left, see Gillian Shields, the pharmacists, most recent note regarding interaction between eliquis and another medication patient is on, she has sent a note to Dr  Caryl Comes to see if he wants to switch patient to pradaxa or coumadin, she has not had response. I spoke with her and she instructed me to send in one months supply of eliquis.

## 2016-07-24 DIAGNOSIS — M1611 Unilateral primary osteoarthritis, right hip: Secondary | ICD-10-CM | POA: Diagnosis not present

## 2016-07-27 MED ORDER — DABIGATRAN ETEXILATE MESYLATE 150 MG PO CAPS: 150.0000 mg | ORAL_CAPSULE | Freq: Two times a day (BID) | ORAL | 3 refills | Status: AC

## 2016-07-27 NOTE — Telephone Encounter (Signed)
E  ie kellEy :) Thanks for pick up dabigitran is great idea as I hope the carbamazepine is necessary

## 2016-07-27 NOTE — Telephone Encounter (Signed)
Spoke with [name] and informed him of Dr. Olin Pia recommendations. Pt Crcl based on most recent labs from 01/2016 is about 73mL/min and he would need Pradaxa 150mg  BID. Pt will pick up medication from pharmacy on Wednesday. Pt states understanding of interaction and need for change.

## 2016-07-30 ENCOUNTER — Ambulatory Visit (INDEPENDENT_AMBULATORY_CARE_PROVIDER_SITE_OTHER): Payer: Medicare Other | Admitting: *Deleted

## 2016-07-30 DIAGNOSIS — I495 Sick sinus syndrome: Secondary | ICD-10-CM | POA: Diagnosis not present

## 2016-07-31 ENCOUNTER — Encounter: Payer: Self-pay | Admitting: Cardiology

## 2016-07-31 NOTE — Progress Notes (Signed)
Remote pacemaker transmission.   

## 2016-08-03 LAB — CUP PACEART REMOTE DEVICE CHECK
Battery Remaining Longevity: 101 mo
Battery Voltage: 2.79 V
Brady Statistic AP VP Percent: 6 %
Brady Statistic AP VS Percent: 94 %
Brady Statistic AS VP Percent: 0 %
Brady Statistic AS VS Percent: 0 %
Date Time Interrogation Session: 20180329175741
Implantable Lead Implant Date: 20061227
Implantable Lead Location: 753860
Implantable Lead Model: 5076
Implantable Lead Model: 6949
Implantable Pulse Generator Implant Date: 20131121
Lead Channel Impedance Value: 441 Ohm
Lead Channel Impedance Value: 454 Ohm
Lead Channel Pacing Threshold Amplitude: 0.375 V
Lead Channel Pacing Threshold Amplitude: 1 V
Lead Channel Pacing Threshold Pulse Width: 0.4 ms
Lead Channel Sensing Intrinsic Amplitude: 8 mV
Lead Channel Setting Pacing Pulse Width: 0.4 ms
Lead Channel Setting Sensing Sensitivity: 2.8 mV
MDC IDC LEAD IMPLANT DT: 20061227
MDC IDC LEAD LOCATION: 753859
MDC IDC MSMT BATTERY IMPEDANCE: 303 Ohm
MDC IDC MSMT LEADCHNL RV PACING THRESHOLD PULSEWIDTH: 0.4 ms
MDC IDC SET LEADCHNL RA PACING AMPLITUDE: 2 V
MDC IDC SET LEADCHNL RV PACING AMPLITUDE: 2.5 V

## 2016-08-05 NOTE — Telephone Encounter (Signed)
See other encounter. Medication now changed to pradaxa per Dr. Caryl Comes. Pt aware of change.

## 2016-08-13 DIAGNOSIS — I429 Cardiomyopathy, unspecified: Secondary | ICD-10-CM | POA: Diagnosis not present

## 2016-08-13 DIAGNOSIS — G500 Trigeminal neuralgia: Secondary | ICD-10-CM | POA: Diagnosis not present

## 2016-08-13 DIAGNOSIS — I4891 Unspecified atrial fibrillation: Secondary | ICD-10-CM | POA: Diagnosis not present

## 2016-08-13 DIAGNOSIS — N401 Enlarged prostate with lower urinary tract symptoms: Secondary | ICD-10-CM | POA: Diagnosis not present

## 2016-08-13 DIAGNOSIS — E063 Autoimmune thyroiditis: Secondary | ICD-10-CM | POA: Diagnosis not present

## 2016-08-13 DIAGNOSIS — C189 Malignant neoplasm of colon, unspecified: Secondary | ICD-10-CM | POA: Diagnosis not present

## 2016-08-13 DIAGNOSIS — E119 Type 2 diabetes mellitus without complications: Secondary | ICD-10-CM | POA: Diagnosis not present

## 2016-08-13 DIAGNOSIS — E663 Overweight: Secondary | ICD-10-CM | POA: Diagnosis not present

## 2016-08-13 DIAGNOSIS — E785 Hyperlipidemia, unspecified: Secondary | ICD-10-CM | POA: Diagnosis not present

## 2016-08-25 DIAGNOSIS — Z79899 Other long term (current) drug therapy: Secondary | ICD-10-CM | POA: Diagnosis not present

## 2016-08-25 DIAGNOSIS — M25551 Pain in right hip: Secondary | ICD-10-CM | POA: Diagnosis not present

## 2016-08-25 DIAGNOSIS — M166 Other bilateral secondary osteoarthritis of hip: Secondary | ICD-10-CM | POA: Diagnosis not present

## 2016-08-25 DIAGNOSIS — M160 Bilateral primary osteoarthritis of hip: Secondary | ICD-10-CM | POA: Diagnosis not present

## 2016-08-26 DIAGNOSIS — M9903 Segmental and somatic dysfunction of lumbar region: Secondary | ICD-10-CM | POA: Diagnosis not present

## 2016-08-26 DIAGNOSIS — M5441 Lumbago with sciatica, right side: Secondary | ICD-10-CM | POA: Diagnosis not present

## 2016-08-26 DIAGNOSIS — M546 Pain in thoracic spine: Secondary | ICD-10-CM | POA: Diagnosis not present

## 2016-08-26 DIAGNOSIS — M9904 Segmental and somatic dysfunction of sacral region: Secondary | ICD-10-CM | POA: Diagnosis not present

## 2016-08-26 DIAGNOSIS — M5136 Other intervertebral disc degeneration, lumbar region: Secondary | ICD-10-CM | POA: Diagnosis not present

## 2016-08-26 DIAGNOSIS — M5134 Other intervertebral disc degeneration, thoracic region: Secondary | ICD-10-CM | POA: Diagnosis not present

## 2016-08-26 DIAGNOSIS — M9902 Segmental and somatic dysfunction of thoracic region: Secondary | ICD-10-CM | POA: Diagnosis not present

## 2016-09-08 DIAGNOSIS — N401 Enlarged prostate with lower urinary tract symptoms: Secondary | ICD-10-CM | POA: Diagnosis not present

## 2016-09-08 DIAGNOSIS — C189 Malignant neoplasm of colon, unspecified: Secondary | ICD-10-CM | POA: Diagnosis not present

## 2016-09-08 DIAGNOSIS — I10 Essential (primary) hypertension: Secondary | ICD-10-CM | POA: Diagnosis not present

## 2016-09-08 DIAGNOSIS — G500 Trigeminal neuralgia: Secondary | ICD-10-CM | POA: Diagnosis not present

## 2016-09-08 DIAGNOSIS — M1990 Unspecified osteoarthritis, unspecified site: Secondary | ICD-10-CM | POA: Diagnosis not present

## 2016-09-08 DIAGNOSIS — I4891 Unspecified atrial fibrillation: Secondary | ICD-10-CM | POA: Diagnosis not present

## 2016-09-08 DIAGNOSIS — E063 Autoimmune thyroiditis: Secondary | ICD-10-CM | POA: Diagnosis not present

## 2016-09-08 DIAGNOSIS — E119 Type 2 diabetes mellitus without complications: Secondary | ICD-10-CM | POA: Diagnosis not present

## 2016-09-17 DIAGNOSIS — I2510 Atherosclerotic heart disease of native coronary artery without angina pectoris: Secondary | ICD-10-CM | POA: Diagnosis not present

## 2016-10-23 DIAGNOSIS — E118 Type 2 diabetes mellitus with unspecified complications: Secondary | ICD-10-CM | POA: Diagnosis not present

## 2016-10-23 DIAGNOSIS — M1610 Unilateral primary osteoarthritis, unspecified hip: Secondary | ICD-10-CM | POA: Diagnosis not present

## 2016-10-23 DIAGNOSIS — I255 Ischemic cardiomyopathy: Secondary | ICD-10-CM | POA: Diagnosis not present

## 2016-10-23 DIAGNOSIS — I2510 Atherosclerotic heart disease of native coronary artery without angina pectoris: Secondary | ICD-10-CM | POA: Diagnosis not present

## 2016-10-23 DIAGNOSIS — Z8679 Personal history of other diseases of the circulatory system: Secondary | ICD-10-CM | POA: Diagnosis not present

## 2016-10-23 DIAGNOSIS — Z950 Presence of cardiac pacemaker: Secondary | ICD-10-CM | POA: Diagnosis not present

## 2016-10-28 DIAGNOSIS — M546 Pain in thoracic spine: Secondary | ICD-10-CM | POA: Diagnosis not present

## 2016-10-28 DIAGNOSIS — M9902 Segmental and somatic dysfunction of thoracic region: Secondary | ICD-10-CM | POA: Diagnosis not present

## 2016-10-28 DIAGNOSIS — M9904 Segmental and somatic dysfunction of sacral region: Secondary | ICD-10-CM | POA: Diagnosis not present

## 2016-10-28 DIAGNOSIS — M5136 Other intervertebral disc degeneration, lumbar region: Secondary | ICD-10-CM | POA: Diagnosis not present

## 2016-10-28 DIAGNOSIS — M9903 Segmental and somatic dysfunction of lumbar region: Secondary | ICD-10-CM | POA: Diagnosis not present

## 2016-10-28 DIAGNOSIS — M5441 Lumbago with sciatica, right side: Secondary | ICD-10-CM | POA: Diagnosis not present

## 2016-10-28 DIAGNOSIS — M5134 Other intervertebral disc degeneration, thoracic region: Secondary | ICD-10-CM | POA: Diagnosis not present

## 2016-10-29 ENCOUNTER — Telehealth: Payer: Self-pay | Admitting: Cardiology

## 2016-10-29 ENCOUNTER — Ambulatory Visit (INDEPENDENT_AMBULATORY_CARE_PROVIDER_SITE_OTHER): Payer: Medicare Other | Admitting: *Deleted

## 2016-10-29 DIAGNOSIS — I495 Sick sinus syndrome: Secondary | ICD-10-CM | POA: Diagnosis not present

## 2016-10-29 NOTE — Telephone Encounter (Signed)
Spoke with pt and reminded pt of remote transmission that is due today. Pt verbalized understanding.   

## 2016-10-30 ENCOUNTER — Encounter: Payer: Self-pay | Admitting: Cardiology

## 2016-10-30 NOTE — Progress Notes (Signed)
Remote pacemaker transmission.   

## 2016-11-12 DIAGNOSIS — Z6823 Body mass index (BMI) 23.0-23.9, adult: Secondary | ICD-10-CM | POA: Diagnosis not present

## 2016-11-12 DIAGNOSIS — E063 Autoimmune thyroiditis: Secondary | ICD-10-CM | POA: Diagnosis not present

## 2016-11-12 DIAGNOSIS — M1990 Unspecified osteoarthritis, unspecified site: Secondary | ICD-10-CM | POA: Diagnosis not present

## 2016-11-12 DIAGNOSIS — N401 Enlarged prostate with lower urinary tract symptoms: Secondary | ICD-10-CM | POA: Diagnosis not present

## 2016-11-12 DIAGNOSIS — N189 Chronic kidney disease, unspecified: Secondary | ICD-10-CM | POA: Diagnosis not present

## 2016-11-12 DIAGNOSIS — Z9181 History of falling: Secondary | ICD-10-CM | POA: Diagnosis not present

## 2016-11-12 DIAGNOSIS — E119 Type 2 diabetes mellitus without complications: Secondary | ICD-10-CM | POA: Diagnosis not present

## 2016-11-12 DIAGNOSIS — I10 Essential (primary) hypertension: Secondary | ICD-10-CM | POA: Diagnosis not present

## 2016-11-12 DIAGNOSIS — I4891 Unspecified atrial fibrillation: Secondary | ICD-10-CM | POA: Diagnosis not present

## 2016-11-12 DIAGNOSIS — G500 Trigeminal neuralgia: Secondary | ICD-10-CM | POA: Diagnosis not present

## 2016-11-12 DIAGNOSIS — I429 Cardiomyopathy, unspecified: Secondary | ICD-10-CM | POA: Diagnosis not present

## 2016-11-12 DIAGNOSIS — Z1389 Encounter for screening for other disorder: Secondary | ICD-10-CM | POA: Diagnosis not present

## 2016-11-13 DIAGNOSIS — M25551 Pain in right hip: Secondary | ICD-10-CM | POA: Diagnosis not present

## 2016-11-13 DIAGNOSIS — M1611 Unilateral primary osteoarthritis, right hip: Secondary | ICD-10-CM | POA: Diagnosis not present

## 2016-11-16 DIAGNOSIS — M1610 Unilateral primary osteoarthritis, unspecified hip: Secondary | ICD-10-CM | POA: Diagnosis not present

## 2016-11-16 DIAGNOSIS — I255 Ischemic cardiomyopathy: Secondary | ICD-10-CM | POA: Diagnosis present

## 2016-11-16 DIAGNOSIS — M1611 Unilateral primary osteoarthritis, right hip: Secondary | ICD-10-CM | POA: Diagnosis present

## 2016-11-16 DIAGNOSIS — Z951 Presence of aortocoronary bypass graft: Secondary | ICD-10-CM | POA: Diagnosis not present

## 2016-11-16 DIAGNOSIS — I2510 Atherosclerotic heart disease of native coronary artery without angina pectoris: Secondary | ICD-10-CM | POA: Diagnosis present

## 2016-11-16 DIAGNOSIS — Z950 Presence of cardiac pacemaker: Secondary | ICD-10-CM | POA: Diagnosis not present

## 2016-11-16 DIAGNOSIS — Z7984 Long term (current) use of oral hypoglycemic drugs: Secondary | ICD-10-CM | POA: Diagnosis not present

## 2016-11-16 DIAGNOSIS — G8918 Other acute postprocedural pain: Secondary | ICD-10-CM | POA: Diagnosis not present

## 2016-11-16 DIAGNOSIS — Z85038 Personal history of other malignant neoplasm of large intestine: Secondary | ICD-10-CM | POA: Diagnosis not present

## 2016-11-16 DIAGNOSIS — E1165 Type 2 diabetes mellitus with hyperglycemia: Secondary | ICD-10-CM | POA: Diagnosis not present

## 2016-11-19 ENCOUNTER — Inpatient Hospital Stay (HOSPITAL_COMMUNITY)
Admission: EM | Admit: 2016-11-19 | Payer: Medicare Other | Source: Other Acute Inpatient Hospital | Admitting: Family Medicine

## 2016-11-19 DIAGNOSIS — Z452 Encounter for adjustment and management of vascular access device: Secondary | ICD-10-CM | POA: Diagnosis not present

## 2016-11-19 DIAGNOSIS — Z471 Aftercare following joint replacement surgery: Secondary | ICD-10-CM | POA: Diagnosis not present

## 2016-11-19 DIAGNOSIS — Z96641 Presence of right artificial hip joint: Secondary | ICD-10-CM | POA: Diagnosis not present

## 2016-11-19 DIAGNOSIS — I252 Old myocardial infarction: Secondary | ICD-10-CM | POA: Diagnosis not present

## 2016-11-19 DIAGNOSIS — E119 Type 2 diabetes mellitus without complications: Secondary | ICD-10-CM | POA: Diagnosis not present

## 2016-11-19 DIAGNOSIS — Z951 Presence of aortocoronary bypass graft: Secondary | ICD-10-CM | POA: Diagnosis not present

## 2016-11-19 DIAGNOSIS — N179 Acute kidney failure, unspecified: Secondary | ICD-10-CM | POA: Diagnosis not present

## 2016-11-19 DIAGNOSIS — Z950 Presence of cardiac pacemaker: Secondary | ICD-10-CM | POA: Diagnosis not present

## 2016-11-19 DIAGNOSIS — E871 Hypo-osmolality and hyponatremia: Secondary | ICD-10-CM | POA: Diagnosis not present

## 2016-11-19 DIAGNOSIS — I214 Non-ST elevation (NSTEMI) myocardial infarction: Secondary | ICD-10-CM | POA: Diagnosis not present

## 2016-11-19 DIAGNOSIS — I951 Orthostatic hypotension: Secondary | ICD-10-CM | POA: Diagnosis not present

## 2016-11-19 DIAGNOSIS — J90 Pleural effusion, not elsewhere classified: Secondary | ICD-10-CM | POA: Diagnosis not present

## 2016-11-20 DIAGNOSIS — I951 Orthostatic hypotension: Secondary | ICD-10-CM | POA: Diagnosis not present

## 2016-11-20 DIAGNOSIS — Z950 Presence of cardiac pacemaker: Secondary | ICD-10-CM | POA: Diagnosis not present

## 2016-11-20 DIAGNOSIS — Z888 Allergy status to other drugs, medicaments and biological substances status: Secondary | ICD-10-CM | POA: Diagnosis not present

## 2016-11-20 DIAGNOSIS — N179 Acute kidney failure, unspecified: Secondary | ICD-10-CM | POA: Diagnosis present

## 2016-11-20 DIAGNOSIS — E119 Type 2 diabetes mellitus without complications: Secondary | ICD-10-CM | POA: Diagnosis present

## 2016-11-20 DIAGNOSIS — I255 Ischemic cardiomyopathy: Secondary | ICD-10-CM | POA: Diagnosis present

## 2016-11-20 DIAGNOSIS — I2510 Atherosclerotic heart disease of native coronary artery without angina pectoris: Secondary | ICD-10-CM | POA: Diagnosis present

## 2016-11-20 DIAGNOSIS — Z66 Do not resuscitate: Secondary | ICD-10-CM | POA: Diagnosis present

## 2016-11-20 DIAGNOSIS — E039 Hypothyroidism, unspecified: Secondary | ICD-10-CM | POA: Diagnosis present

## 2016-11-20 DIAGNOSIS — E871 Hypo-osmolality and hyponatremia: Secondary | ICD-10-CM | POA: Diagnosis present

## 2016-11-20 DIAGNOSIS — R570 Cardiogenic shock: Secondary | ICD-10-CM | POA: Diagnosis present

## 2016-11-20 DIAGNOSIS — I959 Hypotension, unspecified: Secondary | ICD-10-CM | POA: Diagnosis not present

## 2016-11-20 DIAGNOSIS — Z951 Presence of aortocoronary bypass graft: Secondary | ICD-10-CM | POA: Diagnosis not present

## 2016-11-20 DIAGNOSIS — E118 Type 2 diabetes mellitus with unspecified complications: Secondary | ICD-10-CM | POA: Diagnosis not present

## 2016-11-20 DIAGNOSIS — D62 Acute posthemorrhagic anemia: Secondary | ICD-10-CM | POA: Diagnosis present

## 2016-11-20 DIAGNOSIS — E860 Dehydration: Secondary | ICD-10-CM | POA: Diagnosis present

## 2016-11-20 DIAGNOSIS — D649 Anemia, unspecified: Secondary | ICD-10-CM | POA: Diagnosis present

## 2016-11-20 DIAGNOSIS — I4891 Unspecified atrial fibrillation: Secondary | ICD-10-CM | POA: Diagnosis present

## 2016-11-20 DIAGNOSIS — I214 Non-ST elevation (NSTEMI) myocardial infarction: Secondary | ICD-10-CM | POA: Diagnosis present

## 2016-11-20 DIAGNOSIS — G9341 Metabolic encephalopathy: Secondary | ICD-10-CM | POA: Diagnosis present

## 2016-11-20 DIAGNOSIS — I10 Essential (primary) hypertension: Secondary | ICD-10-CM | POA: Diagnosis present

## 2016-11-20 DIAGNOSIS — Z96641 Presence of right artificial hip joint: Secondary | ICD-10-CM | POA: Diagnosis present

## 2016-11-25 LAB — CUP PACEART REMOTE DEVICE CHECK
Battery Impedance: 304 Ohm
Battery Remaining Longevity: 97 mo
Brady Statistic AP VP Percent: 6 %
Brady Statistic AS VP Percent: 0 %
Implantable Lead Implant Date: 20061227
Implantable Lead Implant Date: 20061227
Implantable Lead Location: 753859
Implantable Lead Model: 5076
Implantable Lead Model: 6949
Implantable Pulse Generator Implant Date: 20131121
Lead Channel Impedance Value: 452 Ohm
Lead Channel Pacing Threshold Amplitude: 0.375 V
Lead Channel Pacing Threshold Amplitude: 0.75 V
Lead Channel Pacing Threshold Pulse Width: 0.4 ms
Lead Channel Pacing Threshold Pulse Width: 0.4 ms
Lead Channel Setting Pacing Amplitude: 2 V
Lead Channel Setting Pacing Amplitude: 2.5 V
Lead Channel Setting Sensing Sensitivity: 2.8 mV
MDC IDC LEAD LOCATION: 753860
MDC IDC MSMT BATTERY VOLTAGE: 2.79 V
MDC IDC MSMT LEADCHNL RV IMPEDANCE VALUE: 446 Ohm
MDC IDC SESS DTM: 20180628190634
MDC IDC SET LEADCHNL RV PACING PULSEWIDTH: 0.4 ms
MDC IDC STAT BRADY AP VS PERCENT: 94 %
MDC IDC STAT BRADY AS VS PERCENT: 1 %

## 2016-12-02 DEATH — deceased

## 2017-01-28 ENCOUNTER — Telehealth: Payer: Self-pay | Admitting: Cardiology

## 2017-01-28 ENCOUNTER — Encounter: Payer: Medicare Other | Admitting: *Deleted

## 2017-01-28 NOTE — Telephone Encounter (Signed)
LMOVM reminding pt to send remote transmission.   

## 2017-01-29 ENCOUNTER — Encounter: Payer: Self-pay | Admitting: Cardiology
